# Patient Record
Sex: Female | Born: 1955
Health system: Southern US, Community
[De-identification: ages and names within clinical notes are randomized; demographics above are authoritative.]

## PROBLEM LIST (undated history)

## (undated) DIAGNOSIS — I10 Essential (primary) hypertension: Secondary | ICD-10-CM

## (undated) DIAGNOSIS — E039 Hypothyroidism, unspecified: Secondary | ICD-10-CM

## (undated) DIAGNOSIS — C801 Malignant (primary) neoplasm, unspecified: Secondary | ICD-10-CM

## (undated) HISTORY — PX: ABDOMINAL HYSTERECTOMY: SHX81

## (undated) HISTORY — DX: Malignant (primary) neoplasm, unspecified: C80.1

---

## 1999-02-04 ENCOUNTER — Other Ambulatory Visit: Admission: RE | Admit: 1999-02-04 | Discharge: 1999-02-04 | Payer: Self-pay | Admitting: Family Medicine

## 1999-04-10 ENCOUNTER — Other Ambulatory Visit: Admission: RE | Admit: 1999-04-10 | Discharge: 1999-04-10 | Payer: Self-pay | Admitting: Obstetrics and Gynecology

## 1999-05-15 ENCOUNTER — Ambulatory Visit (HOSPITAL_COMMUNITY): Admission: RE | Admit: 1999-05-15 | Discharge: 1999-05-15 | Payer: Self-pay | Admitting: Obstetrics and Gynecology

## 1999-06-02 ENCOUNTER — Encounter (INDEPENDENT_AMBULATORY_CARE_PROVIDER_SITE_OTHER): Payer: Self-pay

## 1999-06-02 ENCOUNTER — Inpatient Hospital Stay (HOSPITAL_COMMUNITY): Admission: RE | Admit: 1999-06-02 | Discharge: 1999-06-04 | Payer: Self-pay | Admitting: Obstetrics and Gynecology

## 2000-01-07 ENCOUNTER — Encounter: Payer: Self-pay | Admitting: Obstetrics and Gynecology

## 2000-01-07 ENCOUNTER — Ambulatory Visit (HOSPITAL_COMMUNITY): Admission: RE | Admit: 2000-01-07 | Discharge: 2000-01-07 | Payer: Self-pay | Admitting: Obstetrics and Gynecology

## 2000-02-20 ENCOUNTER — Encounter: Payer: Self-pay | Admitting: Obstetrics and Gynecology

## 2000-02-20 ENCOUNTER — Ambulatory Visit (HOSPITAL_COMMUNITY): Admission: RE | Admit: 2000-02-20 | Discharge: 2000-02-20 | Payer: Self-pay | Admitting: Obstetrics and Gynecology

## 2001-02-22 ENCOUNTER — Ambulatory Visit (HOSPITAL_COMMUNITY): Admission: RE | Admit: 2001-02-22 | Discharge: 2001-02-22 | Payer: Self-pay

## 2001-02-28 ENCOUNTER — Encounter: Payer: Self-pay | Admitting: Obstetrics and Gynecology

## 2001-02-28 ENCOUNTER — Encounter: Admission: RE | Admit: 2001-02-28 | Discharge: 2001-02-28 | Payer: Self-pay | Admitting: Obstetrics and Gynecology

## 2001-06-15 ENCOUNTER — Ambulatory Visit (HOSPITAL_COMMUNITY): Admission: RE | Admit: 2001-06-15 | Discharge: 2001-06-15 | Payer: Self-pay | Admitting: Gastroenterology

## 2001-07-27 ENCOUNTER — Encounter: Admission: RE | Admit: 2001-07-27 | Discharge: 2001-07-27 | Payer: Self-pay | Admitting: Gastroenterology

## 2001-07-27 ENCOUNTER — Encounter: Payer: Self-pay | Admitting: Gastroenterology

## 2001-10-03 ENCOUNTER — Encounter: Payer: Self-pay | Admitting: Gastroenterology

## 2001-10-03 ENCOUNTER — Encounter: Admission: RE | Admit: 2001-10-03 | Discharge: 2001-10-03 | Payer: Self-pay | Admitting: Gastroenterology

## 2001-10-05 ENCOUNTER — Encounter: Admission: RE | Admit: 2001-10-05 | Discharge: 2001-10-05 | Payer: Self-pay | Admitting: Gastroenterology

## 2001-10-05 ENCOUNTER — Encounter: Payer: Self-pay | Admitting: Gastroenterology

## 2002-03-02 ENCOUNTER — Encounter: Payer: Self-pay | Admitting: Obstetrics and Gynecology

## 2002-03-02 ENCOUNTER — Encounter: Admission: RE | Admit: 2002-03-02 | Discharge: 2002-03-02 | Payer: Self-pay | Admitting: Obstetrics and Gynecology

## 2003-03-07 ENCOUNTER — Encounter: Admission: RE | Admit: 2003-03-07 | Discharge: 2003-03-07 | Payer: Self-pay | Admitting: Obstetrics and Gynecology

## 2003-03-07 ENCOUNTER — Encounter: Payer: Self-pay | Admitting: Obstetrics and Gynecology

## 2004-03-26 ENCOUNTER — Encounter: Admission: RE | Admit: 2004-03-26 | Discharge: 2004-03-26 | Payer: Self-pay | Admitting: Obstetrics and Gynecology

## 2005-04-09 ENCOUNTER — Encounter: Admission: RE | Admit: 2005-04-09 | Discharge: 2005-04-09 | Payer: Self-pay | Admitting: Obstetrics and Gynecology

## 2006-04-14 ENCOUNTER — Encounter: Admission: RE | Admit: 2006-04-14 | Discharge: 2006-04-14 | Payer: Self-pay | Admitting: Obstetrics and Gynecology

## 2008-03-20 ENCOUNTER — Encounter: Admission: RE | Admit: 2008-03-20 | Discharge: 2008-03-20 | Payer: Self-pay | Admitting: Obstetrics and Gynecology

## 2009-03-21 ENCOUNTER — Encounter: Admission: RE | Admit: 2009-03-21 | Discharge: 2009-03-21 | Payer: Self-pay | Admitting: Obstetrics and Gynecology

## 2009-10-23 ENCOUNTER — Emergency Department (HOSPITAL_COMMUNITY): Admission: EM | Admit: 2009-10-23 | Discharge: 2009-10-23 | Payer: Self-pay | Admitting: Emergency Medicine

## 2010-04-07 ENCOUNTER — Encounter: Admission: RE | Admit: 2010-04-07 | Discharge: 2010-04-07 | Payer: Self-pay | Admitting: Obstetrics and Gynecology

## 2011-02-07 ENCOUNTER — Emergency Department (HOSPITAL_BASED_OUTPATIENT_CLINIC_OR_DEPARTMENT_OTHER)
Admission: EM | Admit: 2011-02-07 | Discharge: 2011-02-08 | Disposition: A | Discharge status: Home / Self Care | Payer: Medicaid Other | Attending: Emergency Medicine | Admitting: Emergency Medicine

## 2011-02-07 DIAGNOSIS — R51 Headache: Secondary | ICD-10-CM | POA: Insufficient documentation

## 2011-02-07 DIAGNOSIS — I10 Essential (primary) hypertension: Secondary | ICD-10-CM | POA: Insufficient documentation

## 2011-03-05 ENCOUNTER — Other Ambulatory Visit: Payer: Medicaid Other

## 2011-03-05 ENCOUNTER — Other Ambulatory Visit: Payer: Self-pay | Admitting: Specialist

## 2011-03-06 ENCOUNTER — Ambulatory Visit
Admission: RE | Admit: 2011-03-06 | Discharge: 2011-03-06 | Disposition: A | Discharge status: Home / Self Care | Payer: Medicaid Other | Source: Ambulatory Visit | Attending: Specialist | Admitting: Specialist

## 2011-03-06 ENCOUNTER — Other Ambulatory Visit: Payer: Self-pay | Admitting: Specialist

## 2011-03-06 NOTE — Procedures (Signed)
[place]. [facility]  Patient:    Lindsey Preston, Lindsey Preston Visit Number: [id number] MRN: [id number]          Service Type: END Location: ENDO Attending Physician:  Orland Mustard Proc. Date: 06/15/01 Adm. Date:  06/15/2001   CC:         Dr. Olin Pia, [address]  [address], [place] [address]   Procedure Report  PROCEDURE PERFORMED:  Esophagogastroduodenscopy.  ENDOSCOPIST:  Llana Aliment. Randa Evens, M.D.  MEDICATIONS:  Cetacaine spray, fentanyl 50 mcg, Versed 5 mg IV.  INDICATIONS:  Abdominal pain with gallbladder work-up negative so far.  DESCRIPTION OF PROCEDURE:  The procedure had been explained to the patient and consent obtained.  With the patient in the left lateral decubitus position, the Olympus video endoscope was inserted blindly into the esophagus and advanced under direct visualization.  The stomach was entered and the pylorus identified and passed.  The duodenum including the bulb and second portion was seen well.  The scope was withdrawn back into the stomach. The pyloric channel and antrum were normal.  Fundus and cardia were seen well on the retroflex view and were normal.  The distal esophagus was quite reddened and not ulcerated.  There was no significant hiatal hernia.  The scope was withdrawn. The patient tolerated the procedure well.  ASSESSMENT:  Esophageal redness, probable reflux.  PLAN:  Will continue on Protonix.  Give anti-reflux instructions and see back in the office in six weeks. Attending Physician:  Orland Mustard DD:  06/15/01 TD:  06/15/01 Job: [id number]

## 2011-03-10 ENCOUNTER — Other Ambulatory Visit: Payer: Self-pay | Admitting: Obstetrics and Gynecology

## 2011-03-10 DIAGNOSIS — Z1231 Encounter for screening mammogram for malignant neoplasm of breast: Secondary | ICD-10-CM

## 2011-04-09 ENCOUNTER — Other Ambulatory Visit: Payer: Self-pay | Admitting: Family Medicine

## 2011-04-09 ENCOUNTER — Ambulatory Visit
Admission: RE | Admit: 2011-04-09 | Discharge: 2011-04-09 | Disposition: A | Discharge status: Home / Self Care | Payer: Medicaid Other | Source: Ambulatory Visit | Attending: Obstetrics and Gynecology | Admitting: Obstetrics and Gynecology

## 2011-04-09 DIAGNOSIS — Z1231 Encounter for screening mammogram for malignant neoplasm of breast: Secondary | ICD-10-CM

## 2012-02-29 ENCOUNTER — Other Ambulatory Visit: Payer: Self-pay | Admitting: Physician Assistant

## 2012-02-29 DIAGNOSIS — Z1231 Encounter for screening mammogram for malignant neoplasm of breast: Secondary | ICD-10-CM

## 2013-02-23 ENCOUNTER — Encounter (HOSPITAL_BASED_OUTPATIENT_CLINIC_OR_DEPARTMENT_OTHER): Payer: Self-pay | Admitting: *Deleted

## 2013-02-23 ENCOUNTER — Emergency Department (HOSPITAL_BASED_OUTPATIENT_CLINIC_OR_DEPARTMENT_OTHER)
Admission: EM | Admit: 2013-02-23 | Discharge: 2013-02-23 | Disposition: A | Discharge status: Home / Self Care | Payer: Medicaid Other | Attending: Emergency Medicine | Admitting: Emergency Medicine

## 2013-02-23 DIAGNOSIS — R112 Nausea with vomiting, unspecified: Secondary | ICD-10-CM | POA: Insufficient documentation

## 2013-02-23 DIAGNOSIS — I10 Essential (primary) hypertension: Secondary | ICD-10-CM | POA: Insufficient documentation

## 2013-02-23 DIAGNOSIS — Z79899 Other long term (current) drug therapy: Secondary | ICD-10-CM | POA: Insufficient documentation

## 2013-02-23 DIAGNOSIS — E039 Hypothyroidism, unspecified: Secondary | ICD-10-CM | POA: Insufficient documentation

## 2013-02-23 DIAGNOSIS — R1110 Vomiting, unspecified: Secondary | ICD-10-CM

## 2013-02-23 HISTORY — DX: Hypothyroidism, unspecified: E03.9

## 2013-02-23 HISTORY — DX: Essential (primary) hypertension: I10

## 2013-02-23 LAB — CBC WITH DIFFERENTIAL/PLATELET
Basophils Absolute: 0 10*3/uL (ref 0.0–0.1)
Basophils Relative: 0 % (ref 0–1)
Eosinophils Absolute: 0.1 10*3/uL (ref 0.0–0.7)
Eosinophils Relative: 1 % (ref 0–5)
HCT: 41.2 % (ref 36.0–46.0)
Hemoglobin: 14.5 g/dL (ref 12.0–15.0)
Lymphocytes Relative: 40 % (ref 12–46)
Lymphs Abs: 1.8 10*3/uL (ref 0.7–4.0)
MCH: 31.1 pg (ref 26.0–34.0)
MCHC: 35.2 g/dL (ref 30.0–36.0)
MCV: 88.4 fL (ref 78.0–100.0)
Monocytes Absolute: 0.7 10*3/uL (ref 0.1–1.0)
Monocytes Relative: 15 % — ABNORMAL HIGH (ref 3–12)
Neutro Abs: 2 10*3/uL (ref 1.7–7.7)
Neutrophils Relative %: 44 % (ref 43–77)
Platelets: 179 10*3/uL (ref 150–400)
RBC: 4.66 MIL/uL (ref 3.87–5.11)
RDW: 12.8 % (ref 11.5–15.5)
WBC: 4.5 10*3/uL (ref 4.0–10.5)

## 2013-02-23 LAB — COMPREHENSIVE METABOLIC PANEL
ALT: 31 U/L (ref 0–35)
AST: 30 U/L (ref 0–37)
Albumin: 4 g/dL (ref 3.5–5.2)
Alkaline Phosphatase: 85 U/L (ref 39–117)
BUN: 13 mg/dL (ref 6–23)
CO2: 26 mEq/L (ref 19–32)
Calcium: 9.5 mg/dL (ref 8.4–10.5)
Chloride: 103 mEq/L (ref 96–112)
Creatinine, Ser: 0.7 mg/dL (ref 0.50–1.10)
GFR calc Af Amer: >90 mL/min (ref >90)
GFR calc non Af Amer: >90 mL/min (ref >90)
Glucose, Bld: 98 mg/dL (ref 70–99)
Potassium: 3.5 mEq/L (ref 3.5–5.1)
Sodium: 140 mEq/L (ref 135–145)
Total Bilirubin: 0.4 mg/dL (ref 0.3–1.2)
Total Protein: 7.3 g/dL (ref 6.0–8.3)

## 2013-02-23 LAB — URINALYSIS, ROUTINE W REFLEX MICROSCOPIC
Bilirubin Urine: NEGATIVE
Glucose, UA: NEGATIVE mg/dL
Ketones, ur: NEGATIVE mg/dL
Leukocytes, UA: NEGATIVE
Nitrite: NEGATIVE
Protein, ur: NEGATIVE mg/dL
Specific Gravity, Urine: 1.008 (ref 1.005–1.030)
Urobilinogen, UA: 0.2 mg/dL (ref 0.0–1.0)
pH: 7 (ref 5.0–8.0)

## 2013-02-23 LAB — LIPASE, BLOOD: Lipase: 22 U/L (ref 11–59)

## 2013-02-23 LAB — URINE MICROSCOPIC-ADD ON

## 2013-02-23 MED ORDER — ONDANSETRON 8 MG PO TBDP: ORAL_TABLET | ORAL | Status: AC

## 2013-02-23 NOTE — ED Notes (Signed)
MD at bedside. 

## 2013-02-23 NOTE — ED Notes (Signed)
Pt speaks [place], daughter is translating- c/o nausea since 8pm yesterday, vomited x 1- denies pain

## 2013-02-23 NOTE — ED Provider Notes (Signed)
History     CSN: [id number]  Arrival date & time 02/23/13  1500   First MD Initiated Contact with Patient 02/23/13 1515      Chief Complaint  Patient presents with  . Nausea    (Consider location/radiation/quality/duration/timing/severity/associated sxs/prior treatment) HPI Comments: Patient presents with complaints of nausea and vomiting. The history was obtained through patient's daughter who is translating. She states the patient has had nausea and vomiting since yesterday. She had one episode of vomiting yesterday and one episode this morning. Currently she feels no complaints. She denies any diarrhea. She denies any current nausea. She denies any abdominal pain. She denies any UTI symptoms. She denies any relationship to eating. She was seen last month for a similar episode by her primary care physician to check some lab work and an ultrasound of her gallbladder which the daughter says is normal. She states the next of diabetes see a gastroenterologist. She came in today because this morning she was feeling more nausea. She denies any current nausea. She denies any chest pain or shortness of breath. Patient denies any dizziness or headache. Last month she was given a prescription for Phenergan to use.   Past Medical History  Diagnosis Date  . Hypertension   . Hypothyroid     Past Surgical History  Procedure Laterality Date  . Abdominal hysterectomy      No family history on file.  History  Substance Use Topics  . Smoking status: Never Smoker   . Smokeless tobacco: Never Used  . Alcohol Use: No    OB History   Grav Para Term Preterm Abortions TAB SAB Ect Mult Living                  Review of Systems  Constitutional: Negative for fever, chills, diaphoresis and fatigue.  HENT: Negative for congestion, rhinorrhea and sneezing.   Eyes: Negative.   Respiratory: Negative for cough, chest tightness and shortness of breath.   Cardiovascular: Negative for chest pain and  leg swelling.  Gastrointestinal: Positive for nausea and vomiting. Negative for abdominal pain, diarrhea and blood in stool.  Genitourinary: Negative for frequency, hematuria, flank pain and difficulty urinating.  Musculoskeletal: Negative for back pain and arthralgias.  Skin: Negative for rash.  Neurological: Negative for dizziness, speech difficulty, weakness, numbness and headaches.    Allergies  Review of patient's allergies indicates no known allergies.  Home Medications   Current Outpatient Rx  Name  Route  Sig  Dispense  Refill  . Levothyroxine Sodium (LEVOTHROID PO)   Oral   Take by mouth.         . promethazine (PHENERGAN) 25 MG tablet   Oral   Take 25 mg by mouth every 6 (six) hours as needed for nausea.         . ondansetron (ZOFRAN ODT) 8 MG disintegrating tablet      8mg  ODT q4 hours prn nausea   10 tablet   0     BP 171/81  Pulse 73  Temp(Src) 98.7 F (37.1 C) (Oral)  Resp 16  Ht 5\' 3"  (1.6 m)  Wt 178 lb (80.74 kg)  BMI 31.54 kg/m2  SpO2 98%  Physical Exam  Constitutional: She is oriented to person, place, and time. She appears well-developed and well-nourished.  HENT:  Head: Normocephalic and atraumatic.  No nystagmus  Eyes: Pupils are equal, round, and reactive to light.  Neck: Normal range of motion. Neck supple.  Cardiovascular: Normal rate, regular rhythm and  normal heart sounds.   Pulmonary/Chest: Effort normal and breath sounds normal. No respiratory distress. She has no wheezes. She has no rales. She exhibits no tenderness.  Abdominal: Soft. Bowel sounds are normal. There is no tenderness. There is no rebound and no guarding.  Musculoskeletal: Normal range of motion. She exhibits no edema.  Lymphadenopathy:    She has no cervical adenopathy.  Neurological: She is alert and oriented to person, place, and time.  Skin: Skin is warm and dry. No rash noted.  Psychiatric: She has a normal mood and affect.    ED Course  Procedures  (including critical care time)  Results for orders placed during the hospital encounter of 02/23/13  URINALYSIS, ROUTINE W REFLEX MICROSCOPIC      Result Value Range   Color, Urine YELLOW  YELLOW   APPearance CLEAR  CLEAR   Specific Gravity, Urine 1.008  1.005 - 1.030   pH 7.0  5.0 - 8.0   Glucose, UA NEGATIVE  NEGATIVE mg/dL   Hgb urine dipstick TRACE (*) NEGATIVE   Bilirubin Urine NEGATIVE  NEGATIVE   Ketones, ur NEGATIVE  NEGATIVE mg/dL   Protein, ur NEGATIVE  NEGATIVE mg/dL   Urobilinogen, UA 0.2  0.0 - 1.0 mg/dL   Nitrite NEGATIVE  NEGATIVE   Leukocytes, UA NEGATIVE  NEGATIVE  CBC WITH DIFFERENTIAL      Result Value Range   WBC 4.5  4.0 - 10.5 K/uL   RBC 4.66  3.87 - 5.11 MIL/uL   Hemoglobin 14.5  12.0 - 15.0 g/dL   HCT 54.0  98.1 - 19.1 %   MCV 88.4  78.0 - 100.0 fL   MCH 31.1  26.0 - 34.0 pg   MCHC 35.2  30.0 - 36.0 g/dL   RDW 47.8  29.5 - 62.1 %   Platelets 179  150 - 400 K/uL   Neutrophils Relative 44  43 - 77 %   Neutro Abs 2.0  1.7 - 7.7 K/uL   Lymphocytes Relative 40  12 - 46 %   Lymphs Abs 1.8  0.7 - 4.0 K/uL   Monocytes Relative 15 (*) 3 - 12 %   Monocytes Absolute 0.7  0.1 - 1.0 K/uL   Eosinophils Relative 1  0 - 5 %   Eosinophils Absolute 0.1  0.0 - 0.7 K/uL   Basophils Relative 0  0 - 1 %   Basophils Absolute 0.0  0.0 - 0.1 K/uL  COMPREHENSIVE METABOLIC PANEL      Result Value Range   Sodium 140  135 - 145 mEq/L   Potassium 3.5  3.5 - 5.1 mEq/L   Chloride 103  96 - 112 mEq/L   CO2 26  19 - 32 mEq/L   Glucose, Bld 98  70 - 99 mg/dL   BUN 13  6 - 23 mg/dL   Creatinine, Ser 3.08  0.50 - 1.10 mg/dL   Calcium 9.5  8.4 - 65.7 mg/dL   Total Protein 7.3  6.0 - 8.3 g/dL   Albumin 4.0  3.5 - 5.2 g/dL   AST 30  0 - 37 U/L   ALT 31  0 - 35 U/L   Alkaline Phosphatase 85  39 - 117 U/L   Total Bilirubin 0.4  0.3 - 1.2 mg/dL   GFR calc non Af Amer >90  >90 mL/min   GFR calc Af Amer >90  >90 mL/min  LIPASE, BLOOD      Result Value Range   Lipase 22  11 -  59  U/L  URINE MICROSCOPIC-ADD ON      Result Value Range   Squamous Epithelial / LPF RARE  RARE   RBC / HPF 0-2  <3 RBC/hpf   Bacteria, UA RARE  RARE   No results found.   1. Vomiting     Date: 02/23/2013  Rate: 75  Rhythm: normal sinus rhythm  QRS Axis: normal  Intervals: normal  ST/T Wave abnormalities: normal  Conduction Disutrbances:none  Narrative Interpretation:   Old EKG Reviewed: none available     MDM  Patient a two-day history of nausea and vomiting. She's only vomited twice in that timeframe. There is no abdominal pain or other suggestions of bowel obstruction. There is no evidence of urinary tract infection. She has no other symptoms of acute coronary syndrome. She has had a recent gallbladder ultrasound which she was told was normal. She has no other reflux symptoms. She was discharged in good condition. I gave her prescription for Zofran and some encourage her to followup with her primary care physician. I also gave her referral to GI.        Rolan Bucco, MD 02/23/13 1640

## 2013-03-29 ENCOUNTER — Emergency Department (HOSPITAL_BASED_OUTPATIENT_CLINIC_OR_DEPARTMENT_OTHER)
Admission: EM | Admit: 2013-03-29 | Discharge: 2013-03-29 | Disposition: A | Discharge status: Home / Self Care | Payer: Medicaid Other | Attending: Emergency Medicine | Admitting: Emergency Medicine

## 2013-03-29 ENCOUNTER — Encounter (HOSPITAL_BASED_OUTPATIENT_CLINIC_OR_DEPARTMENT_OTHER): Payer: Self-pay | Admitting: *Deleted

## 2013-03-29 DIAGNOSIS — Z79899 Other long term (current) drug therapy: Secondary | ICD-10-CM | POA: Insufficient documentation

## 2013-03-29 DIAGNOSIS — R42 Dizziness and giddiness: Secondary | ICD-10-CM | POA: Insufficient documentation

## 2013-03-29 DIAGNOSIS — E039 Hypothyroidism, unspecified: Secondary | ICD-10-CM | POA: Insufficient documentation

## 2013-03-29 DIAGNOSIS — I10 Essential (primary) hypertension: Secondary | ICD-10-CM

## 2013-03-29 MED ORDER — LISINOPRIL 20 MG PO TABS: 20.0000 mg | ORAL_TABLET | Freq: Every day | ORAL | Status: DC

## 2013-03-29 NOTE — ED Notes (Signed)
Pt daughter states that she needs a note for her job to be excused today, and that she needs to leave as soon as possible. Explained to pt and her daughter that we see patients in order of severity and that there are four patients ahead of her to be seen. Pt daughter verbalizes understanding.

## 2013-03-29 NOTE — ED Provider Notes (Signed)
History     CSN: [id number]  Arrival date & time 03/29/13  1424   First MD Initiated Contact with Patient 03/29/13 1548      Chief Complaint  Patient presents with  . Hypertension    (Consider location/radiation/quality/duration/timing/severity/associated sxs/prior treatment) HPI Comments: States she was on lisinopril 20 mg for a prolonged amount of time was taken off approximately 6 months ago by her physician because she had normal blood pressure. She states she has taken her blood pressure intermittently but not regularly and always seems to be in the 130 to 150 range  Patient is a 57 y.o. female presenting with hypertension. The history is provided by the patient.  Hypertension This is a recurrent problem. Episode onset: Unknown because she does not check her blood pressure regularly but when she has checked at the last 2 days has been elevated. The problem occurs constantly. The problem has been gradually worsening. Pertinent negatives include no chest pain, no abdominal pain, no headaches and no shortness of breath. Associated symptoms comments: Occasionally will have dizziness that is short-lived and resolved. Nothing aggravates the symptoms. Nothing relieves the symptoms. She has tried nothing for the symptoms. The treatment provided no relief.    Past Medical History  Diagnosis Date  . Hypertension   . Hypothyroid     Past Surgical History  Procedure Laterality Date  . Abdominal hysterectomy      History reviewed. No pertinent family history.  History  Substance Use Topics  . Smoking status: Never Smoker   . Smokeless tobacco: Never Used  . Alcohol Use: No    OB History   Grav Para Term Preterm Abortions TAB SAB Ect Mult Living                  Review of Systems  Respiratory: Negative for shortness of breath.   Cardiovascular: Negative for chest pain.  Gastrointestinal: Negative for abdominal pain.  Neurological: Positive for dizziness. Negative for  syncope, speech difficulty, weakness, numbness and headaches.  All other systems reviewed and are negative.    Allergies  Review of patient's allergies indicates no known allergies.  Home Medications   Current Outpatient Rx  Name  Route  Sig  Dispense  Refill  . ibuprofen (ADVIL,MOTRIN) 800 MG tablet   Oral   Take 800 mg by mouth every 8 (eight) hours as needed for pain.         . pravastatin (PRAVACHOL) 40 MG tablet   Oral   Take 40 mg by mouth daily.         . Levothyroxine Sodium (LEVOTHROID PO)   Oral   Take by mouth.         . ondansetron (ZOFRAN ODT) 8 MG disintegrating tablet      8mg  ODT q4 hours prn nausea   10 tablet   0   . promethazine (PHENERGAN) 25 MG tablet   Oral   Take 25 mg by mouth every 6 (six) hours as needed for nausea.           BP 165/106  Pulse 68  Temp(Src) 98.2 F (36.8 C) (Oral)  Resp 16  SpO2 95%  Physical Exam  Nursing note and vitals reviewed. Constitutional: She is oriented to person, place, and time. She appears well-developed and well-nourished. No distress.  HENT:  Head: Normocephalic and atraumatic.  Right Ear: Tympanic membrane and ear canal normal.  Left Ear: Tympanic membrane and ear canal normal.  Mouth/Throat: Oropharynx is clear and moist.  Eyes: Conjunctivae and EOM are normal. Pupils are equal, round, and reactive to light.  Neck: Normal range of motion. Neck supple.  Cardiovascular: Normal rate, regular rhythm and intact distal pulses.   No murmur heard. Pulmonary/Chest: Effort normal and breath sounds normal. No respiratory distress. She has no wheezes. She has no rales.  Abdominal: Soft. She exhibits no distension. There is no tenderness. There is no rebound and no guarding.  Musculoskeletal: Normal range of motion. She exhibits no edema and no tenderness.  Neurological: She is alert and oriented to person, place, and time. She has normal strength. No cranial nerve deficit or sensory deficit. Coordination  and gait normal.  Ambulates without difficulty or ataxia  Skin: Skin is warm and dry. No rash noted. No erythema.  Psychiatric: She has a normal mood and affect. Her behavior is normal.    ED Course  Procedures (including critical care time)  Labs Reviewed - No data to display No results found.   No diagnosis found.    MDM   Patient here due to persistent hypertension that has been unchanged over the last several days. Patient states she intermittently feels dizzy but it is not a persistent feeling. She denies any symptoms concerning for stroke. Chest pain or shortness of breath. She used to be on lisinopril 20 mg was taken off approximately 6 months ago because her blood pressure was normal. Patient did not check her blood pressure regularly but most likely has been persistently elevated. Will restart patient's lisinopril 20 mg.        Gwyneth Sprout, MD 03/29/13 [phone]

## 2013-03-29 NOTE — ED Notes (Signed)
Pt reports increased bp at home  X 2 days

## 2013-04-12 ENCOUNTER — Other Ambulatory Visit: Payer: Self-pay | Admitting: Physician Assistant

## 2013-04-13 ENCOUNTER — Encounter: Payer: Self-pay | Admitting: Physician Assistant

## 2013-04-13 NOTE — Progress Notes (Deleted)
   Patient ID: Khamil Lamica MRN: [id number], DOB: 03/04/1956, 57 y.o. Date of Encounter: 04/13/2013, 10:48 AM    Chief Complaint: No chief complaint on file.    HPI: 57 y.o. year old female      Home Meds: See attached medication section for any medications that were entered at today's visit. The computer does not put those onto this list.The following list is a list of meds entered prior to today's visit.   Current Outpatient Prescriptions on File Prior to Visit  Medication Sig Dispense Refill  . ibuprofen (ADVIL,MOTRIN) 800 MG tablet Take 800 mg by mouth every 8 (eight) hours as needed for pain.      . Levothyroxine Sodium (LEVOTHROID PO) Take by mouth.      Marland Kitchen lisinopril (PRINIVIL,ZESTRIL) 20 MG tablet Take 1 tablet (20 mg total) by mouth daily.  30 tablet  1  . ondansetron (ZOFRAN ODT) 8 MG disintegrating tablet 8mg  ODT q4 hours prn nausea  10 tablet  0  . pravastatin (PRAVACHOL) 40 MG tablet Take 40 mg by mouth daily.      . promethazine (PHENERGAN) 25 MG tablet Take 25 mg by mouth every 6 (six) hours as needed for nausea.       No current facility-administered medications on file prior to visit.    Allergies: No Known Allergies    Review of Systems: See HPI for pertinent ROS. All other ROS negative.    Physical Exam:*** There were no vitals taken for this visit., There is no weight on file to calculate BMI. General:  Appears in no acute distress. HEENT: Normocephalic, atraumatic, eyes without discharge, sclera non-icteric, nares are without discharge. Bilateral auditory canals clear, TM's are without perforation, pearly grey and translucent with reflective cone of light bilaterally. Oral cavity moist, posterior pharynx without exudate, erythema, peritonsillar abscess, or post nasal drip.  Neck: Supple. No thyromegaly. No lymphadenopathy. Lungs: Clear bilaterally to auscultation without wheezes, rales, or rhonchi. Breathing is unlabored. Heart: Regular rhythm. No murmurs,  rubs, or gallops. Abdomen: Soft, non-tender, non-distended with normoactive bowel sounds. No hepatomegaly. No rebound/guarding. No obvious abdominal masses. Msk:  Strength and tone normal for age. Extremities/Skin: Warm and dry. No clubbing or cyanosis. No edema. No rashes or suspicious lesions. Neuro: Alert and oriented X 3. Moves all extremities spontaneously. Gait is normal. CNII-XII grossly in tact. Psych:  Responds to questions appropriately with a normal affect.     ASSESSMENT AND PLAN:  57 y.o. year old female with  There are no diagnoses linked to this encounter.  Murray Hodgkins [place], [place], [facility] 04/13/2013 10:48 AM

## 2013-04-14 NOTE — Progress Notes (Signed)
This encounter was created in error - please disregard.

## 2013-05-24 ENCOUNTER — Encounter: Payer: Self-pay | Admitting: Physician Assistant

## 2013-05-24 ENCOUNTER — Ambulatory Visit (INDEPENDENT_AMBULATORY_CARE_PROVIDER_SITE_OTHER): Payer: Medicare Other | Admitting: Physician Assistant

## 2013-05-24 VITALS — BP 140/90 | HR 62 | Temp 97.9°F | Resp 18 | Wt 159.0 lb

## 2013-05-24 DIAGNOSIS — Z01419 Encounter for gynecological examination (general) (routine) without abnormal findings: Secondary | ICD-10-CM

## 2013-05-24 DIAGNOSIS — Z124 Encounter for screening for malignant neoplasm of cervix: Secondary | ICD-10-CM

## 2013-05-24 DIAGNOSIS — C801 Malignant (primary) neoplasm, unspecified: Secondary | ICD-10-CM

## 2013-05-24 DIAGNOSIS — Z1239 Encounter for other screening for malignant neoplasm of breast: Secondary | ICD-10-CM

## 2013-05-24 NOTE — Progress Notes (Signed)
Patient ID: Lindsey Preston MRN: [id number], DOB: Oct 14, 1956, 57 y.o. Date of Encounter: @DATE @  Chief Complaint:  Chief Complaint  Patient presents with  . Gynecologic Exam    HPI: 57 y.o. year old female  presents with her daughter who speaks [place], translates. Pt has another provider who is her PCP. She saw Dr. Elana Alm in the past as her Gynecologist. She has continued to come here to see me for her Gyn care since he retired.  She has h/o cervical cnacer in situ with h/o hysterectomy. No other Gyn history.  She was [employer] for a period of time last year so she did not have pelvic/pap or breast/mammogram in 2013. Now has Medicaid again so able to to all of these now. She has no complaints today.     Past Medical History  Diagnosis Date  . Hypertension   . Hypothyroid   . Cancer     Cervical Cancer InSitu     Home Meds: See attached medication section for current medication list. Any medications entered into computer today will not appear on this note's list. The medications listed below were entered prior to today. Current Outpatient Prescriptions on File Prior to Visit  Medication Sig Dispense Refill  . Levothyroxine Sodium (LEVOTHROID PO) Take by mouth.      Marland Kitchen lisinopril (PRINIVIL,ZESTRIL) 20 MG tablet Take 1 tablet (20 mg total) by mouth daily.  30 tablet  1  . pravastatin (PRAVACHOL) 40 MG tablet Take 40 mg by mouth daily.      . promethazine (PHENERGAN) 25 MG tablet Take 25 mg by mouth every 6 (six) hours as needed for nausea.      Marland Kitchen ibuprofen (ADVIL,MOTRIN) 800 MG tablet Take 800 mg by mouth every 8 (eight) hours as needed for pain.      Marland Kitchen ondansetron (ZOFRAN ODT) 8 MG disintegrating tablet 8mg  ODT q4 hours prn nausea  10 tablet  0   No current facility-administered medications on file prior to visit.    Allergies: No Known Allergies  History   Social History  . Marital Status: Married    Spouse Name: N/A    Number of Children: N/A  . Years of Education:  N/A   Occupational History  . Not on file.   Social History Main Topics  . Smoking status: Never Smoker   . Smokeless tobacco: Never Used  . Alcohol Use: No  . Drug Use: No  . Sexually Active: Not on file   Other Topics Concern  . Not on file   Social History Narrative  . No narrative on file    History reviewed. No pertinent family history.   Review of Systems:  See HPI for pertinent ROS. All other ROS negative.    Physical Exam: Blood pressure 140/90, pulse 62, temperature 97.9 F (36.6 C), temperature source Oral, resp. rate 18, weight 159 lb (72.122 kg)., Body mass index is 28.17 kg/(m^2). General: WNWD Female. Appears in no acute distress. Lungs: Clear bilaterally to auscultation without wheezes, rales, or rhonchi. Breathing is unlabored. Heart: RRR with S1 S2. No murmurs, rubs, or gallops. Abdomen: Soft, non-tender, non-distended with normoactive bowel sounds. No hepatomegaly. No rebound/guarding. No obvious abdominal masses. Musculoskeletal:  Strength and tone normal for age. Extremities/Skin: Warm and dry. No clubbing or cyanosis. No edema. No rashes or suspicious lesions. Neuro: Alert and oriented X 3. Moves all extremities spontaneously. Gait is normal. CNII-XII grossly in tact. Psych:  Responds to questions appropriately with a normal affect. BREAST  Exam: Normal. No masses. No nipple discharge. No skin changes. Breasts symmetrical. PELVIC EXAM: External Genitalia normal. Vaginal mucosa normal. No masses visualized. Bimanual Exam normal with no masses or tenderness.      ASSESSMENT AND PLAN:  57 y.o. year old female with  1. Encounter for cervical Pap smear with pelvic exam - PAP, Thin Prep w/HPV rflx HPV Type 16/18  2. H/O Cervical Cancer In Situ- H/O Hysterectomy. - PAP, Thin Prep w/HPV rflx HPV Type 16/18  3. Screening breast examination 4.Screening Mammogram: Last was 03/2011. Pt aware to call and schedule f/u. Agreeable to do this..   F/U one year,  sooner if needed.    Signed, [address] [place], [place], [facility] 05/24/2013 10:00 AM

## 2013-05-25 ENCOUNTER — Other Ambulatory Visit: Payer: Self-pay

## 2013-05-25 DIAGNOSIS — Z1231 Encounter for screening mammogram for malignant neoplasm of breast: Secondary | ICD-10-CM

## 2013-05-26 LAB — PAP, THIN PREP W/HPV RFLX HPV TYPE 16/18: HPV DNA High Risk: NOT DETECTED

## 2013-05-30 ENCOUNTER — Encounter: Payer: Self-pay | Admitting: Family Medicine

## 2013-06-02 ENCOUNTER — Telehealth: Payer: Self-pay | Admitting: Physician Assistant

## 2013-06-02 NOTE — Telephone Encounter (Signed)
Pt called PAP normal

## 2013-06-13 ENCOUNTER — Ambulatory Visit
Admission: RE | Admit: 2013-06-13 | Discharge: 2013-06-13 | Disposition: A | Discharge status: Home / Self Care | Payer: Medicare Other | Source: Ambulatory Visit

## 2013-06-13 DIAGNOSIS — Z1231 Encounter for screening mammogram for malignant neoplasm of breast: Secondary | ICD-10-CM

## 2014-03-08 ENCOUNTER — Encounter: Payer: Self-pay | Admitting: Internal Medicine

## 2014-04-03 ENCOUNTER — Other Ambulatory Visit (HOSPITAL_COMMUNITY): Payer: Self-pay | Admitting: Gastroenterology

## 2014-04-03 ENCOUNTER — Other Ambulatory Visit: Payer: Self-pay | Admitting: Gastroenterology

## 2014-04-03 DIAGNOSIS — R110 Nausea: Secondary | ICD-10-CM

## 2014-04-11 ENCOUNTER — Encounter (HOSPITAL_COMMUNITY)
Admission: RE | Admit: 2014-04-11 | Discharge: 2014-04-11 | Disposition: A | Discharge status: Home / Self Care | Payer: Medicare Other | Source: Ambulatory Visit | Attending: Gastroenterology | Admitting: Gastroenterology

## 2014-04-11 DIAGNOSIS — R110 Nausea: Secondary | ICD-10-CM | POA: Diagnosis present

## 2014-04-11 MED ORDER — STERILE WATER FOR INJECTION IJ SOLN
INTRAMUSCULAR | Status: AC
  Filled 2014-04-11: qty 10

## 2014-04-11 MED ORDER — SINCALIDE 5 MCG IJ SOLR
0.0200 ug/kg | Freq: Once | INTRAMUSCULAR | Status: AC
  Administered 2014-04-11: 11:00:00 via INTRAVENOUS

## 2014-04-11 MED ORDER — SINCALIDE 5 MCG IJ SOLR
INTRAMUSCULAR | Status: AC
  Filled 2014-04-11: qty 5

## 2014-04-11 MED ORDER — TECHNETIUM TC 99M MEBROFENIN IV KIT
5.0000 | PACK | Freq: Once | INTRAVENOUS | Status: AC | PRN
  Administered 2014-04-11: 5 via INTRAVENOUS

## 2014-04-13 ENCOUNTER — Ambulatory Visit
Admission: RE | Admit: 2014-04-13 | Discharge: 2014-04-13 | Disposition: A | Discharge status: Home / Self Care | Payer: Medicare Other | Source: Ambulatory Visit | Attending: Gastroenterology | Admitting: Gastroenterology

## 2014-04-13 DIAGNOSIS — R110 Nausea: Secondary | ICD-10-CM

## 2014-05-10 ENCOUNTER — Ambulatory Visit: Payer: Medicare Other | Admitting: Internal Medicine

## 2014-08-03 ENCOUNTER — Other Ambulatory Visit: Payer: Self-pay

## 2014-08-03 DIAGNOSIS — Z1231 Encounter for screening mammogram for malignant neoplasm of breast: Secondary | ICD-10-CM

## 2014-08-30 ENCOUNTER — Ambulatory Visit
Admission: RE | Admit: 2014-08-30 | Discharge: 2014-08-30 | Disposition: A | Discharge status: Home / Self Care | Payer: Medicare Other | Source: Ambulatory Visit

## 2014-08-30 DIAGNOSIS — Z1231 Encounter for screening mammogram for malignant neoplasm of breast: Secondary | ICD-10-CM

## 2015-02-07 ENCOUNTER — Other Ambulatory Visit: Payer: Medicare Other | Admitting: Physician Assistant

## 2016-03-27 ENCOUNTER — Emergency Department (HOSPITAL_BASED_OUTPATIENT_CLINIC_OR_DEPARTMENT_OTHER)
Admission: EM | Admit: 2016-03-27 | Discharge: 2016-03-27 | Disposition: A | Discharge status: Home / Self Care | Payer: Medicare Other | Attending: Emergency Medicine | Admitting: Emergency Medicine

## 2016-03-27 ENCOUNTER — Emergency Department (HOSPITAL_BASED_OUTPATIENT_CLINIC_OR_DEPARTMENT_OTHER): Payer: Medicare Other

## 2016-03-27 ENCOUNTER — Encounter (HOSPITAL_BASED_OUTPATIENT_CLINIC_OR_DEPARTMENT_OTHER): Payer: Self-pay | Admitting: Emergency Medicine

## 2016-03-27 DIAGNOSIS — Z8541 Personal history of malignant neoplasm of cervix uteri: Secondary | ICD-10-CM | POA: Insufficient documentation

## 2016-03-27 DIAGNOSIS — Y929 Unspecified place or not applicable: Secondary | ICD-10-CM | POA: Diagnosis not present

## 2016-03-27 DIAGNOSIS — Z79899 Other long term (current) drug therapy: Secondary | ICD-10-CM | POA: Insufficient documentation

## 2016-03-27 DIAGNOSIS — E039 Hypothyroidism, unspecified: Secondary | ICD-10-CM | POA: Diagnosis not present

## 2016-03-27 DIAGNOSIS — X500XXA Overexertion from strenuous movement or load, initial encounter: Secondary | ICD-10-CM | POA: Diagnosis not present

## 2016-03-27 DIAGNOSIS — Y93F2 Activity, caregiving, lifting: Secondary | ICD-10-CM | POA: Diagnosis not present

## 2016-03-27 DIAGNOSIS — S2231XA Fracture of one rib, right side, initial encounter for closed fracture: Secondary | ICD-10-CM | POA: Insufficient documentation

## 2016-03-27 DIAGNOSIS — Y999 Unspecified external cause status: Secondary | ICD-10-CM | POA: Diagnosis not present

## 2016-03-27 DIAGNOSIS — I10 Essential (primary) hypertension: Secondary | ICD-10-CM | POA: Diagnosis not present

## 2016-03-27 DIAGNOSIS — S299XXA Unspecified injury of thorax, initial encounter: Secondary | ICD-10-CM | POA: Diagnosis present

## 2016-03-27 LAB — URINALYSIS, ROUTINE W REFLEX MICROSCOPIC
Bilirubin Urine: NEGATIVE
Glucose, UA: NEGATIVE mg/dL
Ketones, ur: NEGATIVE mg/dL
Leukocytes, UA: NEGATIVE
Nitrite: NEGATIVE
Protein, ur: NEGATIVE mg/dL
Specific Gravity, Urine: 1.015 (ref 1.005–1.030)
pH: 6 (ref 5.0–8.0)

## 2016-03-27 LAB — URINE MICROSCOPIC-ADD ON

## 2016-03-27 MED ORDER — OXYCODONE-ACETAMINOPHEN 5-325 MG PO TABS
1.0000 | ORAL_TABLET | Freq: Once | ORAL | Status: AC
  Administered 2016-03-27: 1 via ORAL
  Filled 2016-03-27: qty 1

## 2016-03-27 MED ORDER — LIDOCAINE 5 % EX PTCH: 1.0000 | MEDICATED_PATCH | CUTANEOUS | Status: AC

## 2016-03-27 MED ORDER — OXYCODONE-ACETAMINOPHEN 5-325 MG PO TABS: 1.0000 | ORAL_TABLET | ORAL | Status: AC | PRN

## 2016-03-27 MED FILL — OXYCODONE/APAP 5-325: 5-325 | 4 days supply | Qty: 20 | Fill #0

## 2016-03-27 NOTE — ED Notes (Signed)
Pt was moving a chair 2 days ago when she leaned over to lift something. Since the pain has progressively worsened.

## 2016-03-27 NOTE — ED Notes (Signed)
Patient transported to X-ray 

## 2016-03-27 NOTE — Discharge Instructions (Signed)
You were seen and evaluated today for your right-sided pain. This appears to be secondary to a small rib fracture. This will take 6-8 weeks to heal. Please use the pain medication prescribed as well as the incentive spirometer. The incentive spirometer will help to keep your lungs open and help to prevent pneumonia. Please follow-up outpatient with her primary care physician next week for reevaluation to make sure you are improving and to make sure that your lung examination remains normal.  Rib Fracture A rib fracture is a break or crack in one of the bones of the ribs. The ribs are a group of long, curved bones that wrap around your chest and attach to your spine. They protect your lungs and other organs in the chest cavity. A broken or cracked rib is often painful, but most do not cause other problems. Most rib fractures heal on their own over time. However, rib fractures can be more serious if multiple ribs are broken or if broken ribs move out of place and push against other structures. CAUSES   A direct blow to the chest. For example, this could happen during contact sports, a car accident, or a fall against a hard object.  Repetitive movements with high force, such as pitching a baseball or having severe coughing spells. SYMPTOMS   Pain when you breathe in or cough.  Pain when someone presses on the injured area. DIAGNOSIS  Your caregiver will perform a physical exam. Various imaging tests may be ordered to confirm the diagnosis and to look for related injuries. These tests may include a chest X-ray, computed tomography (CT), magnetic resonance imaging (MRI), or a bone scan. TREATMENT  Rib fractures usually heal on their own in 1-3 months. The longer healing period is often associated with a continued cough or other aggravating activities. During the healing period, pain control is very important. Medication is usually given to control pain. Hospitalization or surgery may be needed for more  severe injuries, such as those in which multiple ribs are broken or the ribs have moved out of place.  HOME CARE INSTRUCTIONS   Avoid strenuous activity and any activities or movements that cause pain. Be careful during activities and avoid bumping the injured rib.  Gradually increase activity as directed by your caregiver.  Only take over-the-counter or prescription medications as directed by your caregiver. Do not take other medications without asking your caregiver first.  Apply ice to the injured area for the first 1-2 days after you have been treated or as directed by your caregiver. Applying ice helps to reduce inflammation and pain.  Put ice in a plastic bag.  Place a towel between your skin and the bag.   Leave the ice on for 15-20 minutes at a time, every 2 hours while you are awake.  Perform deep breathing as directed by your caregiver. This will help prevent pneumonia, which is a common complication of a broken rib. Your caregiver may instruct you to:  Take deep breaths several times a day.  Try to cough several times a day, holding a pillow against the injured area.  Use a device called an incentive spirometer to practice deep breathing several times a day.  Drink enough fluids to keep your urine clear or pale yellow. This will help you avoid constipation.   Do not wear a rib belt or binder. These restrict breathing, which can lead to pneumonia.  SEEK IMMEDIATE MEDICAL CARE IF:   You have a fever.   You  have difficulty breathing or shortness of breath.   You develop a continual cough, or you cough up thick or bloody sputum.  You feel sick to your stomach (nausea), throw up (vomit), or have abdominal pain.   You have worsening pain not controlled with medications.  MAKE SURE YOU:  Understand these instructions.  Will watch your condition.  Will get help right away if you are not doing well or get worse.   This information is not intended to replace  advice given to you by your health care provider. Make sure you discuss any questions you have with your health care provider.   Document Released: 10/05/2005 Document Revised: 06/07/2013 Document Reviewed: 12/07/2012 Elsevier Interactive Patient Education [employer].

## 2016-03-30 NOTE — ED Provider Notes (Signed)
CSN: [id number]     Arrival date & time 03/27/16  1342 History   First MD Initiated Contact with Patient 03/27/16 1504     Chief Complaint  Patient presents with  . rib pain      (Consider location/radiation/quality/duration/timing/severity/associated sxs/prior Treatment) HPI Comments: 60 y.o. Female with history of hypothyroidism, hypercholesterolemia presents for right chest wall pain.  The patient speak [place] as her primary language and was offered and interpreter but preferred that her daughter act as such.  Patient states that she was was lifting something heavy and leaning against a chair with her right chest against the chair and since that time has had pain in the right lower chest that is made worse with palpating the area and is at times so severe she cannot sleep.  She denies shortness of breath or cough.  No other injury.     Past Medical History  Diagnosis Date  . Hypertension   . Hypothyroid   . Cancer [facility])     Cervical Cancer InSitu   Past Surgical History  Procedure Laterality Date  . Abdominal hysterectomy      Cervical Cancer InSitu   History reviewed. No pertinent family history. Social History  Substance Use Topics  . Smoking status: Never Smoker   . Smokeless tobacco: Never Used  . Alcohol Use: No   OB History    No data available     Review of Systems  Constitutional: Negative for fever and fatigue.  HENT: Negative for congestion.   Respiratory: Negative for cough, chest tightness and shortness of breath.   Cardiovascular: Positive for chest pain (right lower chest wall).  Gastrointestinal: Negative for nausea, vomiting, abdominal pain and diarrhea.  Genitourinary: Negative for dysuria and flank pain.  Musculoskeletal: Negative for myalgias and back pain.  Skin: Negative for rash and wound.  Neurological: Negative for dizziness, weakness and numbness.  Hematological: Does not bruise/bleed easily.      Allergies  Review of patient's  allergies indicates no known allergies.  Home Medications   Prior to Admission medications   Medication Sig Start Date End Date Taking? Authorizing Provider  Levothyroxine Sodium (LEVOTHROID PO) Take by mouth.   Yes Historical Provider, MD  lisinopril (PRINIVIL,ZESTRIL) 20 MG tablet Take 1 tablet (20 mg total) by mouth daily. 03/29/13  Yes Blanchie Dessert, MD  pravastatin (PRAVACHOL) 40 MG tablet Take 40 mg by mouth daily.   Yes Historical Provider, MD  ibuprofen (ADVIL,MOTRIN) 800 MG tablet Take 800 mg by mouth every 8 (eight) hours as needed for pain.    Historical Provider, MD  lidocaine (LIDODERM) 5 % Place 1 patch onto the skin daily. Remove & Discard patch within 12 hours or as directed by MD 03/27/16   Harvel Quale, MD  ondansetron (ZOFRAN ODT) 8 MG disintegrating tablet 8mg  ODT q4 hours prn nausea 02/23/13   Malvin Johns, MD  oxyCODONE-acetaminophen (PERCOCET/ROXICET) 5-325 MG tablet Take 1 tablet by mouth every 4 (four) hours as needed for moderate pain or severe pain. 03/27/16   Harvel Quale, MD  promethazine (PHENERGAN) 25 MG tablet Take 25 mg by mouth every 6 (six) hours as needed for nausea.    Historical Provider, MD   BP 140/84 mmHg  Pulse 63  Temp(Src) 98.7 F (37.1 C) (Oral)  Resp 20  Ht 5\' 6"  (1.676 m)  Wt 140 lb (63.504 kg)  BMI 22.61 kg/m2  SpO2 99% Physical Exam  Constitutional: She is oriented to person, place, and time. She appears  well-developed and well-nourished. No distress.  HENT:  Head: Normocephalic and atraumatic.  Right Ear: External ear normal.  Left Ear: External ear normal.  Nose: Nose normal.  Mouth/Throat: Oropharynx is clear and moist. No oropharyngeal exudate.  Eyes: EOM are normal. Pupils are equal, round, and reactive to light.  Neck: Normal range of motion. Neck supple.  Cardiovascular: Normal rate, regular rhythm, normal heart sounds and intact distal pulses.   No murmur heard. Pulmonary/Chest: Effort normal. No respiratory distress.  She has no wheezes. She has no rales. She exhibits no crepitus.    Abdominal: Soft. She exhibits no distension. There is no tenderness.  Musculoskeletal: Normal range of motion. She exhibits no edema or tenderness.  Neurological: She is alert and oriented to person, place, and time.  Skin: Skin is warm and dry. No rash noted. She is not diaphoretic.  Vitals reviewed.   ED Course  Procedures (including critical care time) Labs Review Labs Reviewed  URINALYSIS, ROUTINE W REFLEX MICROSCOPIC (NOT AT [facility]) - Abnormal; Notable for the following:    Hgb urine dipstick SMALL (*)    All other components within normal limits  URINE MICROSCOPIC-ADD ON - Abnormal; Notable for the following:    Squamous Epithelial / LPF 0-5 (*)    Bacteria, UA FEW (*)    All other components within normal limits    Imaging Review No results found. I have personally reviewed and evaluated these images and lab results as part of my medical decision-making.   EKG Interpretation None      MDM  Patient seen and evaluated in stable condition.  Well appearing, benign examination other than reproducible lower, anterior right rib pain.  Xray with likely displaced fracture of the right 11th rib consistent with examination findings.  Pain resolved with Percocet.  Patient and daughter updated on results, clinical impression, and clinical course.  The expressed understanding as well as agreement with plan for discharge with prescription for percocet and lidoderm patches as well as incentive spirometer.  PAtient discharged home in stable condition.  All questions answered prior to discharge. Final diagnoses:  Rib fracture, right, closed, initial encounter    1. Right rib fracture    Harvel Quale, MD 03/30/16 1219

## 2016-12-22 ENCOUNTER — Other Ambulatory Visit: Payer: Self-pay | Admitting: Family Medicine

## 2016-12-22 DIAGNOSIS — Z1231 Encounter for screening mammogram for malignant neoplasm of breast: Secondary | ICD-10-CM

## 2017-01-08 ENCOUNTER — Ambulatory Visit
Admission: RE | Admit: 2017-01-08 | Discharge: 2017-01-08 | Disposition: A | Discharge status: Home / Self Care | Payer: Medicare Other | Source: Ambulatory Visit | Attending: Family Medicine | Admitting: Family Medicine

## 2017-01-08 DIAGNOSIS — Z1231 Encounter for screening mammogram for malignant neoplasm of breast: Secondary | ICD-10-CM

## 2017-03-18 ENCOUNTER — Other Ambulatory Visit: Payer: Self-pay | Admitting: Gastroenterology

## 2017-03-18 DIAGNOSIS — R1084 Generalized abdominal pain: Secondary | ICD-10-CM

## 2017-03-25 ENCOUNTER — Other Ambulatory Visit: Payer: Medicare Other

## 2017-04-08 ENCOUNTER — Other Ambulatory Visit: Payer: Medicare Other

## 2017-04-27 ENCOUNTER — Emergency Department (HOSPITAL_BASED_OUTPATIENT_CLINIC_OR_DEPARTMENT_OTHER)
Admission: EM | Admit: 2017-04-27 | Discharge: 2017-04-28 | Disposition: A | Discharge status: Home / Self Care | Payer: Medicare Other | Attending: Emergency Medicine | Admitting: Emergency Medicine

## 2017-04-27 ENCOUNTER — Encounter (HOSPITAL_BASED_OUTPATIENT_CLINIC_OR_DEPARTMENT_OTHER): Payer: Self-pay | Admitting: *Deleted

## 2017-04-27 DIAGNOSIS — E039 Hypothyroidism, unspecified: Secondary | ICD-10-CM | POA: Diagnosis not present

## 2017-04-27 DIAGNOSIS — I10 Essential (primary) hypertension: Secondary | ICD-10-CM | POA: Diagnosis not present

## 2017-04-27 DIAGNOSIS — R42 Dizziness and giddiness: Secondary | ICD-10-CM | POA: Insufficient documentation

## 2017-04-27 DIAGNOSIS — Z8541 Personal history of malignant neoplasm of cervix uteri: Secondary | ICD-10-CM | POA: Diagnosis not present

## 2017-04-27 DIAGNOSIS — Z79899 Other long term (current) drug therapy: Secondary | ICD-10-CM | POA: Diagnosis not present

## 2017-04-27 NOTE — ED Triage Notes (Signed)
Pt c/o dizziness and increased bp x 4 hrs

## 2017-04-28 DIAGNOSIS — R42 Dizziness and giddiness: Secondary | ICD-10-CM | POA: Diagnosis not present

## 2017-04-28 LAB — URINALYSIS, ROUTINE W REFLEX MICROSCOPIC
Bilirubin Urine: NEGATIVE
Glucose, UA: NEGATIVE mg/dL
Hgb urine dipstick: NEGATIVE
Ketones, ur: NEGATIVE mg/dL
Leukocytes, UA: NEGATIVE
Nitrite: NEGATIVE
Protein, ur: NEGATIVE mg/dL
Specific Gravity, Urine: 1.01 (ref 1.005–1.030)
pH: 8 (ref 5.0–8.0)

## 2017-04-28 LAB — CBC WITH DIFFERENTIAL/PLATELET
Basophils Absolute: 0 10*3/uL (ref 0.0–0.1)
Basophils Relative: 0 %
Eosinophils Absolute: 0 10*3/uL (ref 0.0–0.7)
Eosinophils Relative: 1 %
HCT: 41.9 % (ref 36.0–46.0)
Hemoglobin: 14.1 g/dL (ref 12.0–15.0)
Lymphocytes Relative: 46 %
Lymphs Abs: 2.3 10*3/uL (ref 0.7–4.0)
MCH: 30.9 pg (ref 26.0–34.0)
MCHC: 33.7 g/dL (ref 30.0–36.0)
MCV: 91.7 fL (ref 78.0–100.0)
Monocytes Absolute: 0.6 10*3/uL (ref 0.1–1.0)
Monocytes Relative: 12 %
Neutro Abs: 2.1 10*3/uL (ref 1.7–7.7)
Neutrophils Relative %: 41 %
Platelets: 203 10*3/uL (ref 150–400)
RBC: 4.57 MIL/uL (ref 3.87–5.11)
RDW: 13.1 % (ref 11.5–15.5)
WBC: 5.1 10*3/uL (ref 4.0–10.5)

## 2017-04-28 LAB — COMPREHENSIVE METABOLIC PANEL
ALT: 14 U/L (ref 14–54)
AST: 19 U/L (ref 15–41)
Albumin: 4.4 g/dL (ref 3.5–5.0)
Alkaline Phosphatase: 71 U/L (ref 38–126)
Anion gap: 10 (ref 5–15)
BUN: 17 mg/dL (ref 6–20)
CO2: 27 mmol/L (ref 22–32)
Calcium: 9.5 mg/dL (ref 8.9–10.3)
Chloride: 103 mmol/L (ref 101–111)
Creatinine, Ser: 0.74 mg/dL (ref 0.44–1.00)
GFR calc Af Amer: >60 mL/min (ref >60)
GFR calc non Af Amer: >60 mL/min (ref >60)
Glucose, Bld: 116 mg/dL — ABNORMAL HIGH (ref 65–99)
Potassium: 3.9 mmol/L (ref 3.5–5.1)
Sodium: 140 mmol/L (ref 135–145)
Total Bilirubin: 0.6 mg/dL (ref 0.3–1.2)
Total Protein: 7.7 g/dL (ref 6.5–8.1)

## 2017-04-28 MED ORDER — MECLIZINE HCL 25 MG PO TABS: 25.0000 mg | ORAL_TABLET | Freq: Once | ORAL | Status: DC

## 2017-04-28 MED ORDER — MECLIZINE HCL 25 MG PO TABS: 25.0000 mg | ORAL_TABLET | Freq: Three times a day (TID) | ORAL | 0 refills | Status: AC | PRN

## 2017-04-28 NOTE — ED Provider Notes (Addendum)
[place] [facility] Provider Note: Georgena Spurling, MD, FACEP  CSN: [id number] MRN: [id number] ARRIVAL: 04/27/17 at 2312 ROOM: MH05/MH05   CHIEF COMPLAINT  Dizziness   HISTORY OF PRESENT ILLNESS  Lindsey Preston is a 61 y.o. female who had the onset of dizziness about 7 PM yesterday evening. By dizziness she means the room was spinning. Symptoms were worse with standing. She felt off balance and had to be carried upstairs by her son. There was some transient nausea but no vomiting. She denies any headache, numbness, weakness, visual changes, hearing changes, chest pain or shortness of breath. Her symptoms abated on their own about 11 PM yesterday evening. She is currently asymptomatic.   Past Medical History:  Diagnosis Date  . Cancer ([place])    Cervical Cancer InSitu  . Hypertension   . Hypothyroid     Past Surgical History:  Procedure Laterality Date  . ABDOMINAL HYSTERECTOMY     Cervical Cancer InSitu    History reviewed. No pertinent family history.  Social History  Substance Use Topics  . Smoking status: Never Smoker  . Smokeless tobacco: Never Used  . Alcohol use No    Prior to Admission medications   Medication Sig Start Date End Date Taking? Authorizing Provider  amLODipine (NORVASC) 5 MG tablet Take 5 mg by mouth daily.   Yes [provider]  ibuprofen (ADVIL,MOTRIN) 800 MG tablet Take 800 mg by mouth every 8 (eight) hours as needed for pain.    [provider]  Levothyroxine Sodium (LEVOTHROID PO) Take by mouth.    [provider]  lidocaine (LIDODERM) 5 % Place 1 patch onto the skin daily. Remove & Discard patch within 12 hours or as directed by MD 03/27/16   Harvel Quale, MD  ondansetron (ZOFRAN ODT) 8 MG disintegrating tablet 8mg  ODT q4 hours prn nausea 02/23/13   Malvin Johns, MD  oxyCODONE-acetaminophen (PERCOCET/ROXICET) 5-325 MG tablet Take 1 tablet by mouth every 4 (four) hours as needed for moderate pain or severe pain.  03/27/16   Harvel Quale, MD  pravastatin (PRAVACHOL) 40 MG tablet Take 40 mg by mouth daily.    [provider]  promethazine (PHENERGAN) 25 MG tablet Take 25 mg by mouth every 6 (six) hours as needed for nausea.    [provider]    Allergies Patient has no known allergies.   REVIEW OF SYSTEMS  Negative except as noted here or in the History of Present Illness.   PHYSICAL EXAMINATION  Initial Vital Signs Blood pressure (!) 148/87, pulse 71, temperature 97.9 F (36.6 C), temperature source Oral, resp. rate 18, height 5\' 5"  (1.651 m), weight 66 kg (145 lb 9.6 oz), SpO2 99 %.  Examination General: Well-developed, well-nourished female in no acute distress; appearance consistent with age of record HENT: normocephalic; atraumatic Eyes: pupils equal, round and reactive to light; extraocular muscles intact; no nystagmus Neck: supple Heart: regular rate and rhythm Lungs: clear to auscultation bilaterally Abdomen: soft; nondistended; nontender; no masses or hepatosplenomegaly; bowel sounds present Extremities: No deformity; full range of motion; pulses normal Neurologic: Awake, alert; motor function intact in all extremities and symmetric; no facial droop; normal coordination and speech Skin: Warm and dry Psychiatric: Normal mood and affect   RESULTS  Summary of this visit's results, reviewed by myself:   EKG Interpretation  Date/Time:  Tuesday April 27 2017 23:22:28 EDT Ventricular Rate:  63 PR Interval:  150 QRS Duration: 78 QT Interval:  410 QTC Calculation: 419 R  Axis:   -41 Text Interpretation:  Normal sinus rhythm Left axis deviation Abnormal ECG No significant change was found Confirmed by Raquelle Pietro [phone]) on 04/27/2017 11:27:27 PM      Laboratory Studies: Results for orders placed or performed during the hospital encounter of 04/27/17 (from the past 24 hour(s))  Urinalysis, Routine w reflex microscopic     Status: Abnormal   Collection Time:  04/27/17 11:50 PM  Result Value Ref Range   Color, Urine YELLOW YELLOW   APPearance CLOUDY (A) CLEAR   Specific Gravity, Urine 1.010 1.005 - 1.030   pH 8.0 5.0 - 8.0   Glucose, UA NEGATIVE NEGATIVE mg/dL   Hgb urine dipstick NEGATIVE NEGATIVE   Bilirubin Urine NEGATIVE NEGATIVE   Ketones, ur NEGATIVE NEGATIVE mg/dL   Protein, ur NEGATIVE NEGATIVE mg/dL   Nitrite NEGATIVE NEGATIVE   Leukocytes, UA NEGATIVE NEGATIVE  CBC with Differential     Status: None   Collection Time: 04/28/17  1:49 AM  Result Value Ref Range   WBC 5.1 4.0 - 10.5 K/uL   RBC 4.57 3.87 - 5.11 MIL/uL   Hemoglobin 14.1 12.0 - 15.0 g/dL   HCT 41.9 36.0 - 46.0 %   MCV 91.7 78.0 - 100.0 fL   MCH 30.9 26.0 - 34.0 pg   MCHC 33.7 30.0 - 36.0 g/dL   RDW 13.1 11.5 - 15.5 %   Platelets 203 150 - 400 K/uL   Neutrophils Relative % 41 %   Neutro Abs 2.1 1.7 - 7.7 K/uL   Lymphocytes Relative 46 %   Lymphs Abs 2.3 0.7 - 4.0 K/uL   Monocytes Relative 12 %   Monocytes Absolute 0.6 0.1 - 1.0 K/uL   Eosinophils Relative 1 %   Eosinophils Absolute 0.0 0.0 - 0.7 K/uL   Basophils Relative 0 %   Basophils Absolute 0.0 0.0 - 0.1 K/uL  Comprehensive metabolic panel     Status: Abnormal   Collection Time: 04/28/17  1:49 AM  Result Value Ref Range   Sodium 140 135 - 145 mmol/L   Potassium 3.9 3.5 - 5.1 mmol/L   Chloride 103 101 - 111 mmol/L   CO2 27 22 - 32 mmol/L   Glucose, Bld 116 (H) 65 - 99 mg/dL   BUN 17 6 - 20 mg/dL   Creatinine, Ser 0.74 0.44 - 1.00 mg/dL   Calcium 9.5 8.9 - 10.3 mg/dL   Total Protein 7.7 6.5 - 8.1 g/dL   Albumin 4.4 3.5 - 5.0 g/dL   AST 19 15 - 41 U/L   ALT 14 14 - 54 U/L   Alkaline Phosphatase 71 38 - 126 U/L   Total Bilirubin 0.6 0.3 - 1.2 mg/dL   GFR calc non Af Amer >60 >60 mL/min   GFR calc Af Amer >60 >60 mL/min   Anion gap 10 5 - 15   Imaging Studies: No results found.  ED COURSE  Nursing notes and initial vitals signs, including pulse oximetry, reviewed.  Vitals:   04/27/17 2316  04/27/17 2317 04/27/17 2318 04/28/17 0141  BP:   (!) 174/93 (!) 148/87  Pulse:   61 71  Resp:   19 18  Temp:   97.9 F (36.6 C)   TempSrc:   Oral   SpO2:   99% 99%  Weight:  65.8 kg (145 lb) 66 kg (145 lb 9.6 oz)   Height: 5\' 5"  (1.651 m)      Patient's history is consistent with peripheral vertigo. She is asymptomatic  at the present time. We will prescribe meclizine and she has a primary care physician with whom she can follow-up.  PROCEDURES    ED DIAGNOSES     ICD-10-CM   1. Vertigo R42        Koreena Joost, MD 04/28/17 0240    Shanon Rosser, MD 04/28/17 [phone]

## 2017-12-15 ENCOUNTER — Other Ambulatory Visit: Payer: Self-pay | Admitting: Family Medicine

## 2017-12-15 DIAGNOSIS — Z1231 Encounter for screening mammogram for malignant neoplasm of breast: Secondary | ICD-10-CM

## 2018-01-10 ENCOUNTER — Ambulatory Visit
Admission: RE | Admit: 2018-01-10 | Discharge: 2018-01-10 | Disposition: A | Discharge status: Home / Self Care | Payer: Medicare Other | Source: Ambulatory Visit | Attending: Family Medicine | Admitting: Family Medicine

## 2018-01-10 DIAGNOSIS — Z1231 Encounter for screening mammogram for malignant neoplasm of breast: Secondary | ICD-10-CM

## 2018-12-09 ENCOUNTER — Other Ambulatory Visit: Payer: Self-pay | Admitting: Family Medicine

## 2018-12-09 DIAGNOSIS — Z1231 Encounter for screening mammogram for malignant neoplasm of breast: Secondary | ICD-10-CM

## 2018-12-30 ENCOUNTER — Other Ambulatory Visit: Payer: Self-pay | Admitting: Family Medicine

## 2018-12-30 DIAGNOSIS — Z780 Asymptomatic menopausal state: Secondary | ICD-10-CM

## 2019-01-16 ENCOUNTER — Ambulatory Visit: Payer: Medicare Other

## 2019-02-15 ENCOUNTER — Ambulatory Visit: Payer: Medicare Other

## 2019-03-06 ENCOUNTER — Other Ambulatory Visit: Payer: Medicare Other

## 2019-03-06 ENCOUNTER — Ambulatory Visit: Payer: Medicare Other

## 2019-05-11 ENCOUNTER — Other Ambulatory Visit: Payer: Medicare Other

## 2019-05-11 ENCOUNTER — Ambulatory Visit: Payer: Medicare Other

## 2019-08-15 ENCOUNTER — Ambulatory Visit
Admission: RE | Admit: 2019-08-15 | Discharge: 2019-08-15 | Disposition: A | Discharge status: Home / Self Care | Payer: Medicare Other | Source: Ambulatory Visit | Attending: Family Medicine | Admitting: Family Medicine

## 2019-08-15 ENCOUNTER — Other Ambulatory Visit: Payer: Self-pay

## 2019-08-15 DIAGNOSIS — Z780 Asymptomatic menopausal state: Secondary | ICD-10-CM

## 2019-08-15 DIAGNOSIS — Z1231 Encounter for screening mammogram for malignant neoplasm of breast: Secondary | ICD-10-CM

## 2020-08-07 ENCOUNTER — Other Ambulatory Visit: Payer: Self-pay | Admitting: Family Medicine

## 2020-08-07 DIAGNOSIS — Z1231 Encounter for screening mammogram for malignant neoplasm of breast: Secondary | ICD-10-CM

## 2020-09-05 ENCOUNTER — Ambulatory Visit
Admission: RE | Admit: 2020-09-05 | Discharge: 2020-09-05 | Disposition: A | Discharge status: Home / Self Care | Payer: Medicare Other | Source: Ambulatory Visit | Attending: Family Medicine | Admitting: Family Medicine

## 2020-09-05 ENCOUNTER — Other Ambulatory Visit: Payer: Self-pay

## 2020-09-05 DIAGNOSIS — Z1231 Encounter for screening mammogram for malignant neoplasm of breast: Secondary | ICD-10-CM

## 2020-10-22 ENCOUNTER — Other Ambulatory Visit: Payer: Self-pay

## 2020-10-22 ENCOUNTER — Ambulatory Visit
Admission: RE | Admit: 2020-10-22 | Discharge: 2020-10-22 | Disposition: A | Discharge status: Home / Self Care | Payer: Medicare Other | Source: Ambulatory Visit | Attending: Family Medicine | Admitting: Family Medicine

## 2021-07-31 ENCOUNTER — Other Ambulatory Visit: Payer: Self-pay | Admitting: Family Medicine

## 2021-07-31 DIAGNOSIS — M810 Age-related osteoporosis without current pathological fracture: Secondary | ICD-10-CM

## 2021-09-16 ENCOUNTER — Other Ambulatory Visit: Payer: Self-pay | Admitting: Family Medicine

## 2021-09-16 DIAGNOSIS — Z1231 Encounter for screening mammogram for malignant neoplasm of breast: Secondary | ICD-10-CM

## 2021-10-23 ENCOUNTER — Ambulatory Visit
Admission: RE | Admit: 2021-10-23 | Discharge: 2021-10-23 | Disposition: A | Discharge status: Home / Self Care | Payer: Medicare Other | Source: Ambulatory Visit | Attending: Family Medicine | Admitting: Family Medicine

## 2021-10-23 DIAGNOSIS — Z1231 Encounter for screening mammogram for malignant neoplasm of breast: Secondary | ICD-10-CM

## 2022-08-23 IMAGING — MG DIGITAL SCREENING BILAT W/ TOMO W/ CAD
8 series · 9 of 24 positions shown · non-contrast
Comparison: Previous exam(s).

CLINICAL DATA: Screening.

EXAM:
DIGITAL SCREENING BILATERAL MAMMOGRAM WITH TOMO AND CAD

[L CC synth-2D]
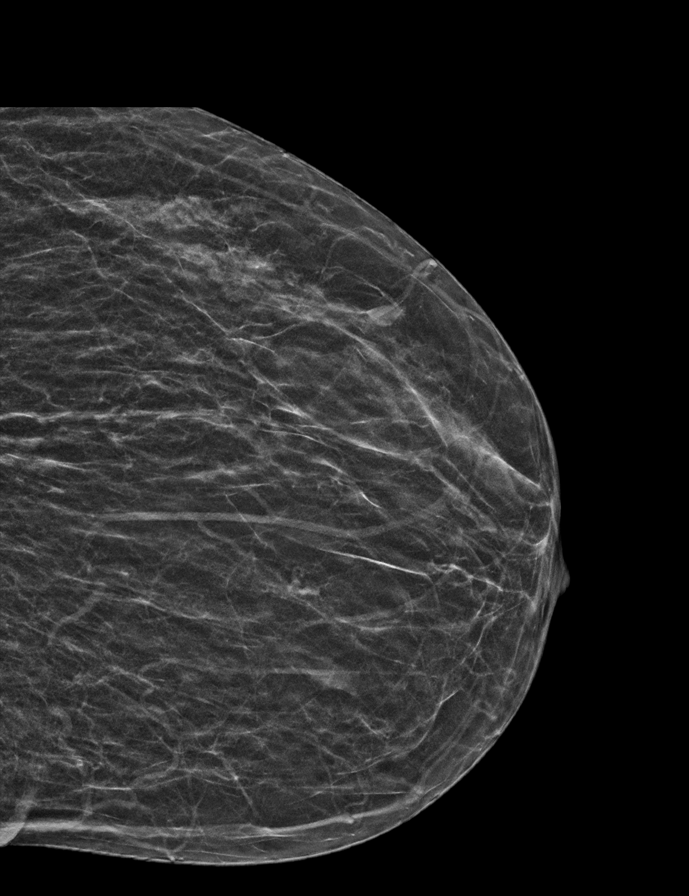

[L MLO synth-2D]
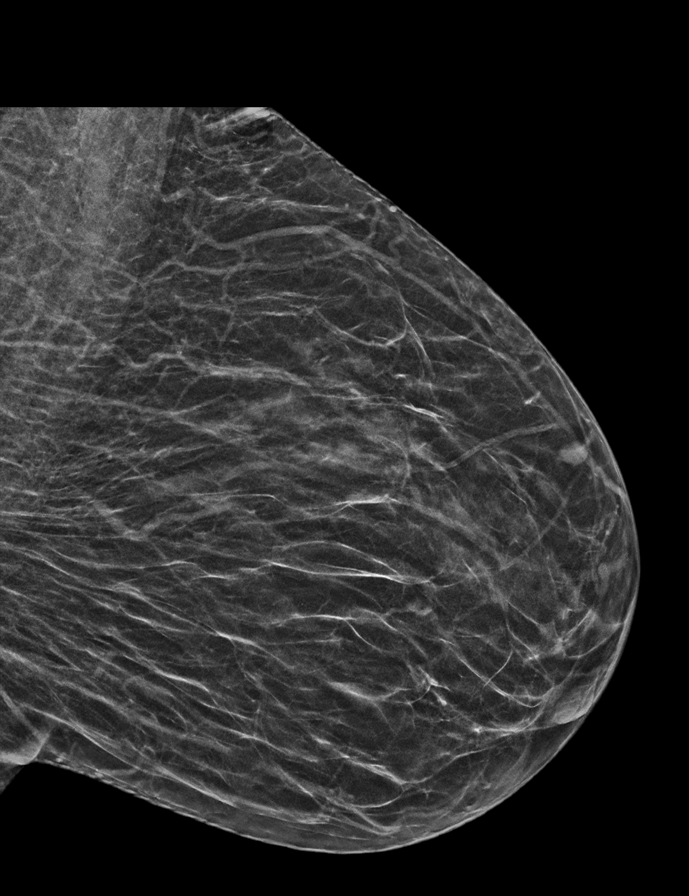

[R CC synth-2D]
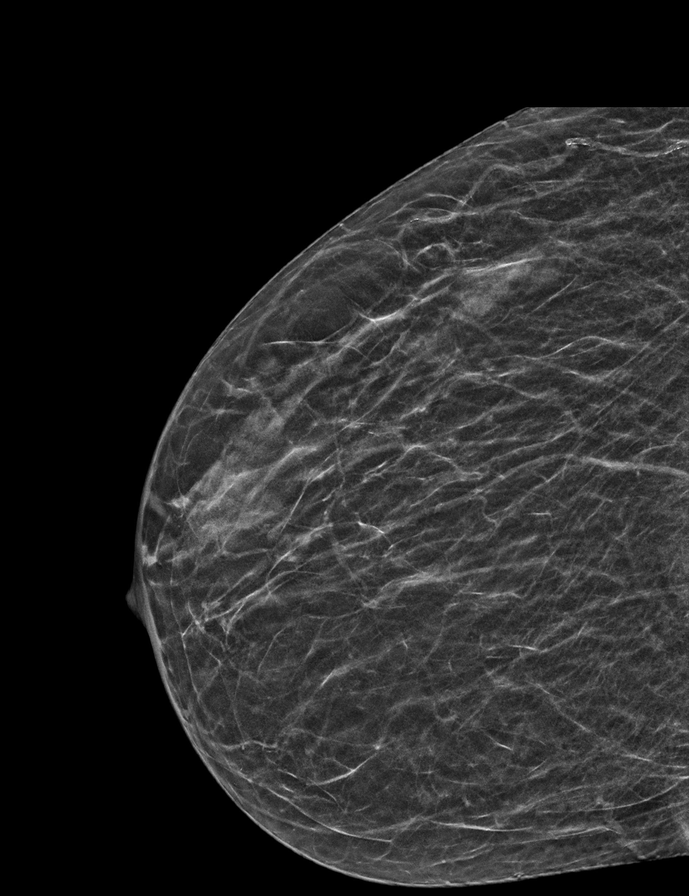

[R MLO synth-2D]
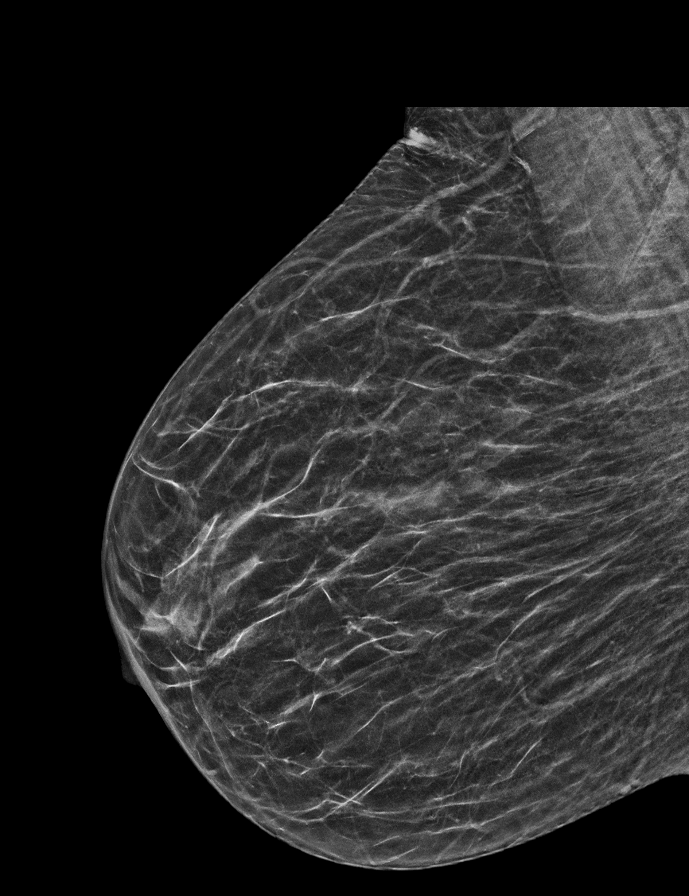

[R CC tomo · 2 of 36 frames shown]
[frame 12/36]
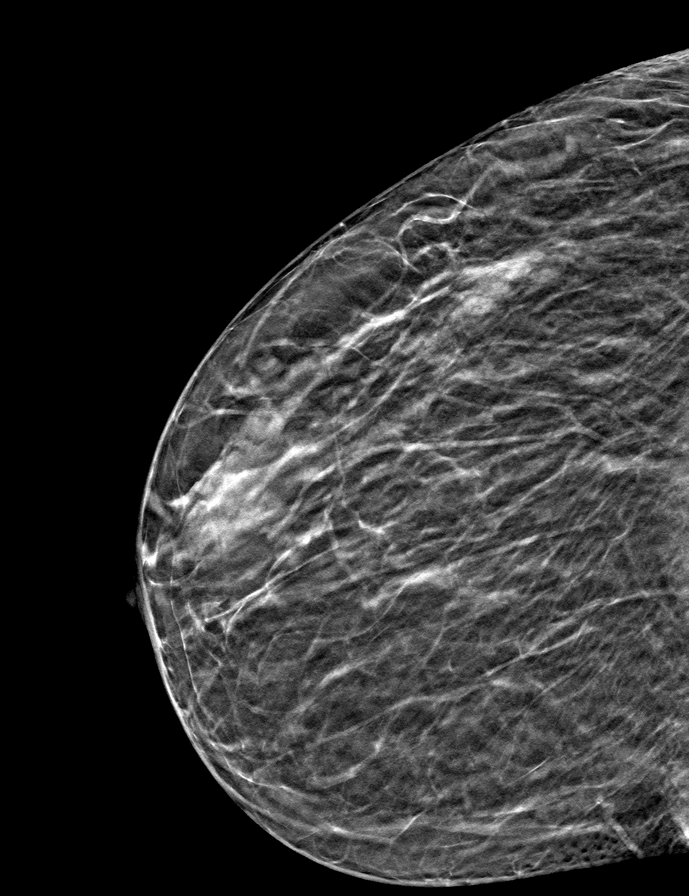
[frame 19/36]
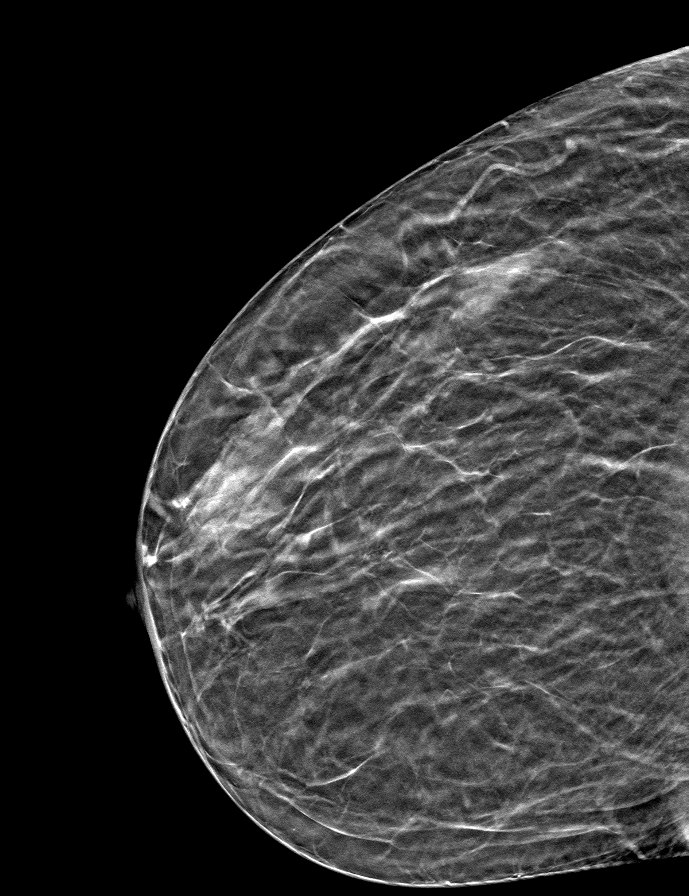

[R MLO tomo · tomo slice 21/40.0]
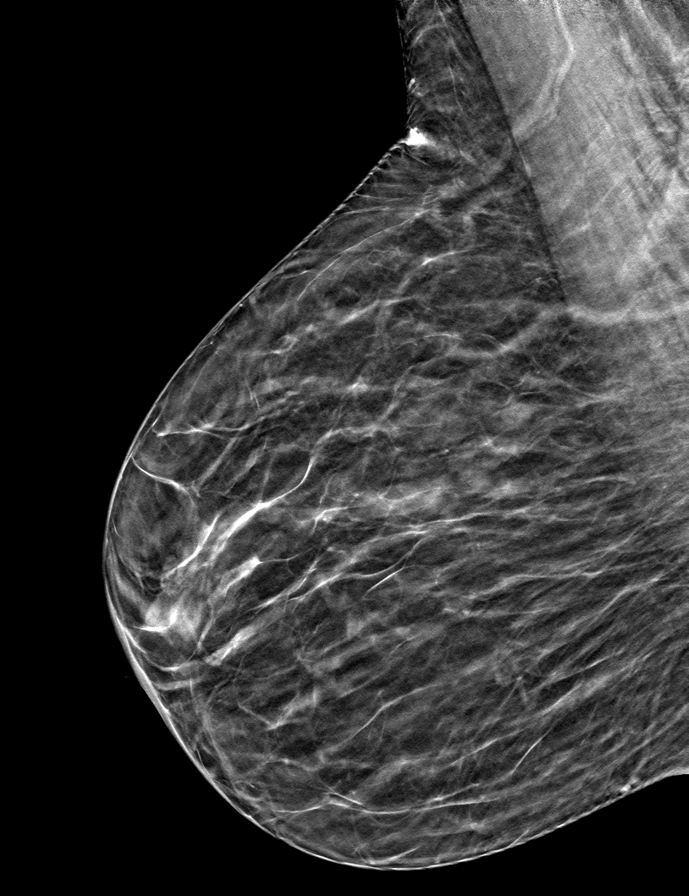

[L CC tomo · tomo slice 17/33.0]
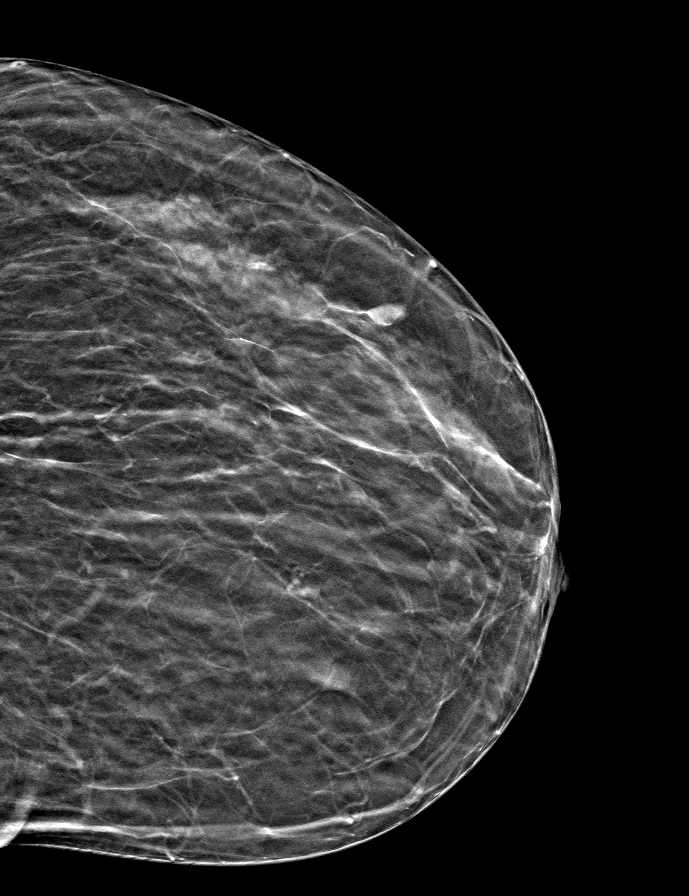

[L MLO tomo · tomo slice 20/39.0]
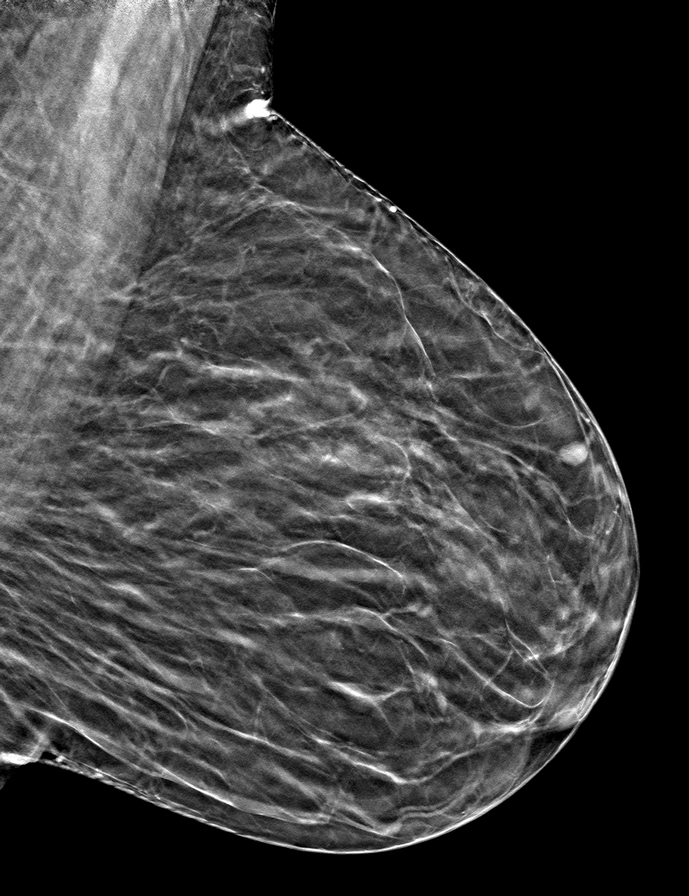

[9 of 24 positions shown; findings below may reference images not displayed]

ACR Breast Density Category b: There are scattered areas of
fibroglandular density.
FINDINGS: There are no findings suspicious for malignancy. Images were
processed with CAD.
IMPRESSION: No mammographic evidence of malignancy. A result letter of this
screening mammogram will be mailed directly to the patient.

RECOMMENDATION:
Screening mammogram in one year. (Code:CN-U-775)

BI-RADS CATEGORY  1: Negative.

## 2022-10-27 ENCOUNTER — Other Ambulatory Visit: Payer: Self-pay | Admitting: Family Medicine

## 2022-10-27 DIAGNOSIS — M810 Age-related osteoporosis without current pathological fracture: Secondary | ICD-10-CM

## 2022-11-03 ENCOUNTER — Other Ambulatory Visit: Payer: Self-pay | Admitting: Family Medicine

## 2022-11-03 DIAGNOSIS — M810 Age-related osteoporosis without current pathological fracture: Secondary | ICD-10-CM

## 2022-11-06 ENCOUNTER — Ambulatory Visit
Admission: RE | Admit: 2022-11-06 | Discharge: 2022-11-06 | Disposition: A | Discharge status: Home / Self Care | Payer: Medicare Other | Source: Ambulatory Visit | Attending: Family Medicine | Admitting: Family Medicine

## 2022-11-06 DIAGNOSIS — M810 Age-related osteoporosis without current pathological fracture: Secondary | ICD-10-CM

## 2023-09-13 ENCOUNTER — Other Ambulatory Visit: Payer: Self-pay

## 2023-09-13 ENCOUNTER — Other Ambulatory Visit (HOSPITAL_BASED_OUTPATIENT_CLINIC_OR_DEPARTMENT_OTHER): Payer: Self-pay

## 2023-09-13 ENCOUNTER — Emergency Department (HOSPITAL_BASED_OUTPATIENT_CLINIC_OR_DEPARTMENT_OTHER)
Admission: EM | Admit: 2023-09-13 | Discharge: 2023-09-13 | Disposition: A | Discharge status: Home / Self Care | Payer: Medicare Other | Attending: Emergency Medicine | Admitting: Emergency Medicine

## 2023-09-13 ENCOUNTER — Emergency Department (HOSPITAL_BASED_OUTPATIENT_CLINIC_OR_DEPARTMENT_OTHER): Payer: Medicare Other

## 2023-09-13 ENCOUNTER — Encounter (HOSPITAL_BASED_OUTPATIENT_CLINIC_OR_DEPARTMENT_OTHER): Payer: Self-pay

## 2023-09-13 DIAGNOSIS — Z79899 Other long term (current) drug therapy: Secondary | ICD-10-CM | POA: Insufficient documentation

## 2023-09-13 DIAGNOSIS — Z8541 Personal history of malignant neoplasm of cervix uteri: Secondary | ICD-10-CM | POA: Insufficient documentation

## 2023-09-13 DIAGNOSIS — I10 Essential (primary) hypertension: Secondary | ICD-10-CM | POA: Insufficient documentation

## 2023-09-13 DIAGNOSIS — Z1152 Encounter for screening for COVID-19: Secondary | ICD-10-CM | POA: Diagnosis not present

## 2023-09-13 DIAGNOSIS — J209 Acute bronchitis, unspecified: Secondary | ICD-10-CM | POA: Diagnosis not present

## 2023-09-13 DIAGNOSIS — R059 Cough, unspecified: Secondary | ICD-10-CM | POA: Diagnosis present

## 2023-09-13 LAB — RESP PANEL BY RT-PCR (RSV, FLU A&B, COVID)  RVPGX2
Influenza A by PCR: NEGATIVE
Influenza B by PCR: NEGATIVE
Resp Syncytial Virus by PCR: NEGATIVE
SARS Coronavirus 2 by RT PCR: NEGATIVE

## 2023-09-13 LAB — I-STAT CHEM 8, ED
BUN: 13 mg/dL (ref 8–23)
Calcium, Ion: 1.15 mmol/L (ref 1.15–1.40)
Chloride: 100 mmol/L (ref 98–111)
Creatinine, Ser: 0.7 mg/dL (ref 0.44–1.00)
Glucose, Bld: 99 mg/dL (ref 70–99)
HCT: 41 % (ref 36.0–46.0)
Hemoglobin: 13.9 g/dL (ref 12.0–15.0)
Potassium: 4 mmol/L (ref 3.5–5.1)
Sodium: 138 mmol/L (ref 135–145)
TCO2: 26 mmol/L (ref 22–32)

## 2023-09-13 MED ORDER — DOXYCYCLINE HYCLATE 100 MG PO CAPS
100.0000 mg | ORAL_CAPSULE | Freq: Two times a day (BID) | ORAL | 0 refills | Status: AC
  Filled 2023-09-13: qty 20, 10d supply, fill #0

## 2023-09-13 MED ORDER — PREDNISONE 20 MG PO TABS
40.0000 mg | ORAL_TABLET | Freq: Every day | ORAL | 0 refills | Status: AC
  Filled 2023-09-13: qty 10, 5d supply, fill #0

## 2023-09-13 NOTE — Discharge Instructions (Signed)
Take antibiotics and steroids as prescribed.  Return if symptoms worsen.  Follow-up with primary care doctor.

## 2023-09-13 NOTE — ED Triage Notes (Signed)
Pt reports she has been coughing x 2 weeks. Cough has been non productive but now has gotten to the point where she has noticed a little blood when she coughs. She also has had episodes of vomiting yesterday, none today. Denies any pain or shortness of breath.

## 2023-09-13 NOTE — ED Provider Notes (Signed)
[place] [facility] Provider Note   CSN: [id number] Arrival date & time: 09/13/23  1524     History  Chief Complaint  Patient presents with   Cough    Lindsey Preston is a 67 y.o. female.  Patient here with cough congestion.  Ongoing for about a week or 2.  Denies any chest pain shortness of breath nausea or vomiting except for she did vomit after coughing really hard the other day.  Maybe some blood but only happened 1 time.  She is not on any blood thinners.  No recent surgery or travel.  No blood clot history.  She has a history of hypertension.  She denies any shortness of breath.  She has just been having a harsh cough.  No history of COPD or smoking.  The history is provided by the patient.       Home Medications Prior to Admission medications   Medication Sig Start Date End Date Taking? Authorizing Provider  doxycycline (VIBRAMYCIN) 100 MG capsule Take 1 capsule (100 mg total) by mouth 2 (two) times daily. 09/13/23  Yes Karee Forge, DO  predniSONE (DELTASONE) 20 MG tablet Take 2 tablets (40 mg total) by mouth daily for 5 days. 09/13/23 09/18/23 Yes Cherylene Ferrufino, DO  amLODipine (NORVASC) 5 MG tablet Take 5 mg by mouth daily.    [provider]  ibuprofen (ADVIL,MOTRIN) 800 MG tablet Take 800 mg by mouth every 8 (eight) hours as needed for pain.    [provider]  Levothyroxine Sodium (LEVOTHROID PO) Take by mouth.    [provider]  lidocaine (LIDODERM) 5 % Place 1 patch onto the skin daily. Remove & Discard patch within 12 hours or as directed by MD 03/27/16   Leta Baptist, MD  meclizine (ANTIVERT) 25 MG tablet Take 1 tablet (25 mg total) by mouth 3 (three) times daily as needed for dizziness. 04/28/17   Molpus, John, MD  ondansetron (ZOFRAN ODT) 8 MG disintegrating tablet 8mg  ODT q4 hours prn nausea 02/23/13   Rolan Bucco, MD  oxyCODONE-acetaminophen (PERCOCET/ROXICET) 5-325 MG tablet Take 1 tablet by  mouth every 4 (four) hours as needed for moderate pain or severe pain. 03/27/16   Leta Baptist, MD  pravastatin (PRAVACHOL) 40 MG tablet Take 40 mg by mouth daily.    [provider]  promethazine (PHENERGAN) 25 MG tablet Take 25 mg by mouth every 6 (six) hours as needed for nausea.    [provider]      Allergies    Patient has no known allergies.    Review of Systems   Review of Systems  Physical Exam Updated Vital Signs BP (!) 150/91 (BP Location: Left Arm)   Pulse 68   Temp 97.8 F (36.6 C)   Resp 20   Ht 5\' 6"  (1.676 m)   Wt 59 kg   SpO2 99%   BMI 20.98 kg/m  Physical Exam Vitals and nursing note reviewed.  Constitutional:      General: She is not in acute distress.    Appearance: She is well-developed.  HENT:     Head: Normocephalic and atraumatic.     Mouth/Throat:     Mouth: Mucous membranes are moist.  Eyes:     Extraocular Movements: Extraocular movements intact.     Conjunctiva/sclera: Conjunctivae normal.     Pupils: Pupils are equal, round, and reactive to light.  Cardiovascular:     Rate and Rhythm: Normal rate and  regular rhythm.     Pulses: Normal pulses.     Heart sounds: Normal heart sounds. No murmur heard. Pulmonary:     Effort: Pulmonary effort is normal. No respiratory distress.     Breath sounds: Normal breath sounds.  Abdominal:     Palpations: Abdomen is soft.     Tenderness: There is no abdominal tenderness.  Musculoskeletal:        General: No swelling.     Cervical back: Neck supple.  Skin:    General: Skin is warm and dry.     Capillary Refill: Capillary refill takes less than 2 seconds.  Neurological:     Mental Status: She is alert.  Psychiatric:        Mood and Affect: Mood normal.     ED Results / Procedures / Treatments   Labs (all labs ordered are listed, but only abnormal results are displayed) Labs Reviewed  RESP PANEL BY RT-PCR (RSV, FLU A&B, COVID)  [id number]  I-STAT CHEM 8, ED    EKG EKG  Interpretation Date/Time:  Monday September 13 2023 16:28:24 EST Ventricular Rate:  65 PR Interval:  144 QRS Duration:  82 QT Interval:  396 QTC Calculation: 411 R Axis:   196  Text Interpretation: Normal sinus rhythm Right superior axis deviation Possible Right ventricular hypertrophy Abnormal ECG When compared with ECG of 27-Apr-2017 23:22, PREVIOUS ECG IS PRESENT Confirmed by Virgina Norfolk [phone]) on 09/13/2023 4:29:52 PM  Radiology DG Chest 2 View  Result Date: 09/13/2023 CLINICAL DATA:  Cough for several days.  Vomiting. EXAM: CHEST - 2 VIEW COMPARISON:  03/27/2016 FINDINGS: The heart size and mediastinal contours are within normal limits. Both lungs are clear. The visualized skeletal structures are unremarkable. IMPRESSION: No active cardiopulmonary disease. Electronically Signed   By: Danae Orleans M.D.   On: 09/13/2023 16:57    Procedures Procedures    Medications Ordered in ED Medications - No data to display  ED Course/ Medical Decision Making/ A&P                                 Medical Decision Making Amount and/or Complexity of Data Reviewed Radiology: ordered.  Risk Prescription drug management.   Lindsey Preston is here with cough.  Unremarkable vitals.  Very well-appearing.  Dry/harsh cough for a week or 2.  History of hypertension, cervical cancer.  She is not on any chemotherapy.  She is very well-appearing.  Blood work unremarkable per my review and interpretation.  Chest x-ray with no evidence of pneumonia pneumothorax.  COVID and flu test negative.  She is not having any chest pain or shortness of breath.  Have no concern for ACS or PE.  She has a fairly prominent cough on exam.  Overall I suspect she has acute bronchitis.  Will treat her with antibiotics and steroids.  She understands return precautions.  Discharged in good condition.  Understands follow-up.  This chart was dictated using voice recognition software.  Despite best efforts to proofread,  errors  can occur which can change the documentation meaning.         Final Clinical Impression(s) / ED Diagnoses Final diagnoses:  Acute bronchitis, unspecified organism    Rx / DC Orders ED Discharge Orders          Ordered    doxycycline (VIBRAMYCIN) 100 MG capsule  2 times daily        09/13/23 1725  predniSONE (DELTASONE) 20 MG tablet  Daily        09/13/23 1725              Virgina Norfolk, DO 09/13/23 1736

## 2023-11-05 ENCOUNTER — Other Ambulatory Visit: Payer: Self-pay | Admitting: Family Medicine

## 2023-11-05 DIAGNOSIS — Z1231 Encounter for screening mammogram for malignant neoplasm of breast: Secondary | ICD-10-CM

## 2023-11-10 ENCOUNTER — Ambulatory Visit: Payer: Medicare Other

## 2023-11-26 ENCOUNTER — Ambulatory Visit: Payer: Medicare Other

## 2024-02-04 ENCOUNTER — Ambulatory Visit
Admission: RE | Admit: 2024-02-04 | Discharge: 2024-02-04 | Disposition: A | Discharge status: Home / Self Care | Source: Ambulatory Visit | Attending: Family Medicine | Admitting: Family Medicine

## 2024-02-04 ENCOUNTER — Ambulatory Visit

## 2024-02-04 DIAGNOSIS — Z1231 Encounter for screening mammogram for malignant neoplasm of breast: Secondary | ICD-10-CM
